# Patient Record
Sex: Female | Born: 1945 | Race: Black or African American | Hispanic: No | Marital: Married | Smoking: Never smoker
Health system: Southern US, Community
[De-identification: ages and names within clinical notes are randomized; demographics above are authoritative.]

## PROBLEM LIST (undated history)

## (undated) DIAGNOSIS — F329 Major depressive disorder, single episode, unspecified: Secondary | ICD-10-CM

## (undated) DIAGNOSIS — K219 Gastro-esophageal reflux disease without esophagitis: Secondary | ICD-10-CM

## (undated) DIAGNOSIS — T451X5A Adverse effect of antineoplastic and immunosuppressive drugs, initial encounter: Secondary | ICD-10-CM

## (undated) DIAGNOSIS — D6481 Anemia due to antineoplastic chemotherapy: Secondary | ICD-10-CM

## (undated) DIAGNOSIS — F32A Depression, unspecified: Secondary | ICD-10-CM

## (undated) DIAGNOSIS — C801 Malignant (primary) neoplasm, unspecified: Secondary | ICD-10-CM

## (undated) DIAGNOSIS — I10 Essential (primary) hypertension: Secondary | ICD-10-CM

## (undated) DIAGNOSIS — F419 Anxiety disorder, unspecified: Secondary | ICD-10-CM

## (undated) HISTORY — PX: PORT-A-CATH REMOVAL: SHX5289

## (undated) HISTORY — PX: BREAST SURGERY: SHX581

---

## 2015-09-09 ENCOUNTER — Other Ambulatory Visit: Payer: Self-pay | Admitting: Unknown Physician Specialty

## 2015-09-09 DIAGNOSIS — R928 Other abnormal and inconclusive findings on diagnostic imaging of breast: Secondary | ICD-10-CM

## 2015-09-09 DIAGNOSIS — R921 Mammographic calcification found on diagnostic imaging of breast: Secondary | ICD-10-CM

## 2015-09-18 ENCOUNTER — Inpatient Hospital Stay: Admission: RE | Admit: 2015-09-18 | Payer: Self-pay | Source: Ambulatory Visit

## 2015-09-25 ENCOUNTER — Other Ambulatory Visit (HOSPITAL_COMMUNITY): Payer: Self-pay | Admitting: Internal Medicine

## 2015-09-25 DIAGNOSIS — C50812 Malignant neoplasm of overlapping sites of left female breast: Secondary | ICD-10-CM

## 2015-09-26 HISTORY — PX: OTHER SURGICAL HISTORY: SHX169

## 2015-09-30 ENCOUNTER — Ambulatory Visit (HOSPITAL_COMMUNITY)
Admission: RE | Admit: 2015-09-30 | Discharge: 2015-09-30 | Disposition: A | Discharge status: Home / Self Care | Payer: Medicare PPO | Source: Ambulatory Visit | Attending: Internal Medicine | Admitting: Internal Medicine

## 2015-09-30 DIAGNOSIS — D259 Leiomyoma of uterus, unspecified: Secondary | ICD-10-CM | POA: Diagnosis not present

## 2015-09-30 DIAGNOSIS — N200 Calculus of kidney: Secondary | ICD-10-CM | POA: Diagnosis not present

## 2015-09-30 DIAGNOSIS — C50812 Malignant neoplasm of overlapping sites of left female breast: Secondary | ICD-10-CM | POA: Diagnosis present

## 2015-09-30 DIAGNOSIS — R938 Abnormal findings on diagnostic imaging of other specified body structures: Secondary | ICD-10-CM | POA: Insufficient documentation

## 2015-09-30 DIAGNOSIS — K7689 Other specified diseases of liver: Secondary | ICD-10-CM | POA: Insufficient documentation

## 2015-09-30 MED ORDER — IOHEXOL 300 MG/ML  SOLN
50.0000 mL | Freq: Once | INTRAMUSCULAR | Status: AC | PRN
  Administered 2015-09-30: 50 mL via ORAL

## 2015-09-30 MED ORDER — IOPAMIDOL (ISOVUE-300) INJECTION 61%
100.0000 mL | Freq: Once | INTRAVENOUS | Status: AC | PRN
  Administered 2015-09-30: 100 mL via INTRAVENOUS

## 2015-10-05 ENCOUNTER — Other Ambulatory Visit (HOSPITAL_COMMUNITY): Payer: Self-pay | Admitting: Internal Medicine

## 2015-10-05 DIAGNOSIS — C50811 Malignant neoplasm of overlapping sites of right female breast: Secondary | ICD-10-CM

## 2015-10-05 DIAGNOSIS — C50812 Malignant neoplasm of overlapping sites of left female breast: Principal | ICD-10-CM

## 2015-10-08 ENCOUNTER — Ambulatory Visit (HOSPITAL_COMMUNITY)
Admission: RE | Admit: 2015-10-08 | Discharge: 2015-10-08 | Disposition: A | Discharge status: Home / Self Care | Payer: Medicare PPO | Source: Ambulatory Visit | Attending: Internal Medicine | Admitting: Internal Medicine

## 2015-10-08 DIAGNOSIS — C50812 Malignant neoplasm of overlapping sites of left female breast: Secondary | ICD-10-CM | POA: Insufficient documentation

## 2015-10-08 DIAGNOSIS — R938 Abnormal findings on diagnostic imaging of other specified body structures: Secondary | ICD-10-CM | POA: Diagnosis not present

## 2015-10-08 DIAGNOSIS — C50811 Malignant neoplasm of overlapping sites of right female breast: Secondary | ICD-10-CM | POA: Diagnosis present

## 2015-10-08 LAB — GLUCOSE, CAPILLARY: GLUCOSE-CAPILLARY: 85 mg/dL (ref 65–99)

## 2015-10-08 MED ORDER — FLUDEOXYGLUCOSE F - 18 (FDG) INJECTION
9.6000 | Freq: Once | INTRAVENOUS | Status: AC | PRN
  Administered 2015-10-08: 9.6 via INTRAVENOUS

## 2015-10-13 ENCOUNTER — Ambulatory Visit
Admission: RE | Admit: 2015-10-13 | Discharge: 2015-10-13 | Disposition: A | Discharge status: Home / Self Care | Payer: Medicare PPO | Source: Ambulatory Visit | Attending: Unknown Physician Specialty | Admitting: Unknown Physician Specialty

## 2015-10-13 DIAGNOSIS — R921 Mammographic calcification found on diagnostic imaging of breast: Secondary | ICD-10-CM

## 2015-10-13 DIAGNOSIS — R928 Other abnormal and inconclusive findings on diagnostic imaging of breast: Secondary | ICD-10-CM

## 2015-11-27 ENCOUNTER — Telehealth: Payer: Self-pay | Admitting: Oncology

## 2015-11-27 NOTE — Telephone Encounter (Signed)
Received a call back from [place] at [facility]. Per Suanne Marker Dr. Jacquiline Doe has spoken with patient re seeing LL 6/5 - please proceed with appointment and per Suanne Marker she will speak with patient. Appointment scheduled and given to [facility] for 11/30/2015 @ 3 pm to arrive 2:30 pm for lab and Dr. Marko Plume. I also spoke with patient re appointment date/time/location/phone number and confirmed demographic and insurance information.

## 2015-11-27 NOTE — Telephone Encounter (Signed)
Received information from desk nurse re transfer of care from Dr. Jacquiline Doe to Dr. Marko Plume. Obtained name/DOB/office note from desk nurse and other demographic information from [place] at [facility]. Per Dr. Mariana Kaufman staff message she can see patient 6/5 @ 3:30 pm with  labs. Spoke with patient re 6/5 appointment and per patient she does not want to have any treatments prior to going on vacation and will resume when she returns. Patient was informed 6/5 would be an initial appointment to establish care with Dr. Marko Plume and will not involve treatment. Per patient she cannot come 11/30/15 as she does not know if she will have someone to bring her. Per patient the only time she can come in next week would be 6/9 which is a Friday. Patient informed Dr. Marko Plume is only in the office on Mondays and Thursdays, however a message would be sent to Dr. Marko Plume to see if a one time Friday visit could be accommodated. Patient also reiterated she does not want any treatments until after she returns from her vacation. Per patient she will leave for vacation 6/12 and return 6/19. Both desk nurse for Dr. Marko Plume and Suanne Marker at [facility] informed. Patient also encourage to speak with Dr. Al Decant office regarding her wishes. Above message routed to Dr. Marko Plume.

## 2015-11-30 ENCOUNTER — Ambulatory Visit (HOSPITAL_BASED_OUTPATIENT_CLINIC_OR_DEPARTMENT_OTHER): Payer: Medicare PPO | Admitting: Oncology

## 2015-11-30 ENCOUNTER — Other Ambulatory Visit: Payer: Self-pay | Admitting: Oncology

## 2015-11-30 ENCOUNTER — Telehealth: Payer: Self-pay | Admitting: Oncology

## 2015-11-30 ENCOUNTER — Other Ambulatory Visit (HOSPITAL_BASED_OUTPATIENT_CLINIC_OR_DEPARTMENT_OTHER): Payer: Medicare PPO

## 2015-11-30 VITALS — BP 168/98 | HR 83 | Temp 98.9°F | Resp 18 | Ht 63.5 in | Wt 188.9 lb

## 2015-11-30 DIAGNOSIS — C801 Malignant (primary) neoplasm, unspecified: Secondary | ICD-10-CM

## 2015-11-30 DIAGNOSIS — D6959 Other secondary thrombocytopenia: Secondary | ICD-10-CM | POA: Diagnosis not present

## 2015-11-30 DIAGNOSIS — T451X5A Adverse effect of antineoplastic and immunosuppressive drugs, initial encounter: Secondary | ICD-10-CM

## 2015-11-30 DIAGNOSIS — D701 Agranulocytosis secondary to cancer chemotherapy: Secondary | ICD-10-CM | POA: Diagnosis not present

## 2015-11-30 DIAGNOSIS — D6481 Anemia due to antineoplastic chemotherapy: Secondary | ICD-10-CM | POA: Insufficient documentation

## 2015-11-30 DIAGNOSIS — Z95828 Presence of other vascular implants and grafts: Secondary | ICD-10-CM | POA: Insufficient documentation

## 2015-11-30 DIAGNOSIS — I10 Essential (primary) hypertension: Secondary | ICD-10-CM | POA: Insufficient documentation

## 2015-11-30 DIAGNOSIS — C50912 Malignant neoplasm of unspecified site of left female breast: Secondary | ICD-10-CM | POA: Diagnosis not present

## 2015-11-30 DIAGNOSIS — K089 Disorder of teeth and supporting structures, unspecified: Secondary | ICD-10-CM

## 2015-11-30 LAB — CBC WITH DIFFERENTIAL/PLATELET
BASO%: 0.7 % (ref 0.0–2.0)
BASOS ABS: 0.1 10*3/uL (ref 0.0–0.1)
EOS ABS: 0 10*3/uL (ref 0.0–0.5)
EOS%: 0.5 % (ref 0.0–7.0)
HCT: 29.1 % — ABNORMAL LOW (ref 34.8–46.6)
HGB: 9.7 g/dL — ABNORMAL LOW (ref 11.6–15.9)
LYMPH%: 17 % (ref 14.0–49.7)
MCH: 30.3 pg (ref 25.1–34.0)
MCHC: 33.3 g/dL (ref 31.5–36.0)
MCV: 91.1 fL (ref 79.5–101.0)
MONO#: 0.7 10*3/uL (ref 0.1–0.9)
MONO%: 10.2 % (ref 0.0–14.0)
NEUT#: 5.2 10*3/uL (ref 1.5–6.5)
NEUT%: 71.6 % (ref 38.4–76.8)
PLATELETS: 76 10*3/uL — AB (ref 145–400)
RBC: 3.2 10*6/uL — AB (ref 3.70–5.45)
RDW: 19 % — ABNORMAL HIGH (ref 11.2–14.5)
WBC: 7.2 10*3/uL (ref 3.9–10.3)
lymph#: 1.2 10*3/uL (ref 0.9–3.3)

## 2015-11-30 LAB — COMPREHENSIVE METABOLIC PANEL
ALT: 10 U/L (ref 0–55)
ANION GAP: 6 meq/L (ref 3–11)
AST: 12 U/L (ref 5–34)
Albumin: 3.5 g/dL (ref 3.5–5.0)
Alkaline Phosphatase: 115 U/L (ref 40–150)
BILIRUBIN TOTAL: 0.55 mg/dL (ref 0.20–1.20)
BUN: 13.7 mg/dL (ref 7.0–26.0)
CHLORIDE: 116 meq/L — AB (ref 98–109)
CO2: 23 meq/L (ref 22–29)
Calcium: 10.4 mg/dL (ref 8.4–10.4)
Creatinine: 1.3 mg/dL — ABNORMAL HIGH (ref 0.6–1.1)
EGFR: 47 mL/min/{1.73_m2} — AB (ref >90)
Glucose: 103 mg/dl (ref 70–140)
Potassium: 4 mEq/L (ref 3.5–5.1)
Sodium: 145 mEq/L (ref 136–145)
Total Protein: 7 g/dL (ref 6.4–8.3)

## 2015-11-30 LAB — TECHNOLOGIST REVIEW

## 2015-11-30 MED ORDER — PROCHLORPERAZINE MALEATE 10 MG PO TABS: 10.0000 mg | ORAL_TABLET | Freq: Four times a day (QID) | ORAL | Status: AC | PRN

## 2015-11-30 MED ORDER — ATENOLOL 50 MG PO TABS: 50.0000 mg | ORAL_TABLET | Freq: Every day | ORAL | Status: DC

## 2015-11-30 NOTE — Progress Notes (Signed)
[facility] NEW PATIENT EVALUATION   Name: Maria Strickland Date: November 30, 2015  MRN: [id number] DOB: 1946-01-06  REFERRING PHYSICIAN: Milus Height, MD CC: Maria Strickland, Maria Strickland   REASON FOR REFERRAL: transfer of medical oncology care, small cell carcinoma left breast   HISTORY OF PRESENT ILLNESS:Maria Strickland is a 70 y.o. female who is seen in consultation, together with daughter Maria Strickland and son Maria Strickland  , at the request of Maria Strickland, for transfer of care under active chemo for small cell carcinoma of left breast. This may be limited disease, tho there was question of splenic lesion on staging, and no imaging of head.   Patient had not had regular medical care in a number of years when she noticed 2 masses in central left breast in Dec 2016. She was seen by Maria Maria Strickland in [place], with bilateral mammograms done 09-02-15 at [facility]. The mammograms, with no priors for comparison, showed in the left breast a dense irregular mass at least 3 cm in greatest dimension, with pleomorphic calcifications extending 5 cm inferomedially  in left breast and with associated nipple inversion and retraction; also in the left breast was a 10 mm irregular mass at 9-10 o'clock. In the right breast an irregular mass centrally slightly medial to nipple 1.3 cm diameter. [place] on left breast showed the mass at 12 o'clock measured 2.8 x 2.4 x 2.5 cm with internal vascularity, and additional hypoechoic mass at 9 o'clock 0.8 x 0.4 x 0.6 cm. In right breast by [place] no suspicious solid or cystic mass identified. 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 Morphologically the neuroendocrine carcinoma is reported to show small to intermediate sized cells with high mitotic count and prominent  necrosis, strongly positive for pancytokeratin and negative for chromogranin A, positive for synaptophysin, strongly positive for TTF-1, ER 1%, PR 105, Ki-67 of 96%. She subsequently had biopsy of the right breast lesion ([id number]) at [facility], no malignancy.  Staging studies done as follows:  Bone scan [facility] 10-05-15 with bilateral anterior lower chest activity not clearly malignant, symmetrical renal activity, osteoarthritis left knee. CT CAP at [facility] 09-30-15 : Chest no hilar or mediastinal adenopathy, thyroid normal, no pericardial effusion, no pulmonary lesions or pleural effusion, no lytic or blastic skeletal lesions. Abdomen numerous hepatic cysts without evidence of hepatic mets, adrenals mildly nodular, numerous renal cysts without hydronephrosis, spleen without focal lesions, pancreas normal, bowels unremarkable other than diverticulosis descending and sigmoid. No adenopathy. Pelvis no adenopathy, uterine fibroids, endometrium thickened at 23 mm , no ascites, bladder normal . PET Elvina Sidle 7-62-83 no hypermetabolic hilar or  Mediastinal nodes, no pulmonary uptake, left breast mass with SUV 22.89 measuring 3.3 cm without uptake in axillary, retropectoral or supraclavicular nodes. An area of uptake anterior spleen with SUV max 5.63 corresponding to low density structure not present on 09-30-15, no liver concerns, no other uptake abdomen or pelvis. No mention of endometrial lining. Diffuse heterogeneous uptake thru axial and appendicular skeleton which would be consistent either with diffuse marrow/ bone involvement or anemia. Maria Strickland discussed with IR, decision made not to attempt biopsy of spleen.  Maria Strickland discussed case with breast oncologist Maria Maria Strickland and Maria Maria Strickland. Recommendation was for neoadjuvant carbo VP16 x 4 cycles with consideration of additional  anthracyclines, then consideration of mastectomy with axillary node evaluation and further management depending  on surgical findings.  Note patient refused MRI brain during initial staging.  Patient received cycle 1 carbo VP16 ~ May 1-2-3, those chemo records not included in outside information. She received neulasta, however developed profound neutropenia without febrile complications, thrombocytopenia and anemia (hgb 7.9) for which she was transfused 2 units PRBCs. Cycle 2 was given 5-22,23,24, reportedly dose reduced from cycle 1. Cycle 2 carbo dose total was 445.5 mg and VP16 118 mg, with decadron 12 mg and Aloxi 0.25 mg. VP16 doses on 5-23 and 5-24 were each 118 mg total , with decadron 12 mg as only antiemetic.  Family tells me that she had neulasta also after cycle 2, that information not in these outside records.  Labs 11-16-15 WBC 7.2, ANC 4.8, Hgb 10.2, plt 202k Chemistries date not clear to me (listed as "2 weeks ago") creat 1.01, BUN 15, calcium 9.9, Tprot 6.4, alb 3.65, AP 90, AST 18, Tbili 0.7, ALT 15, Na 145, k 4.3, Cl 111, glu 94   From my direct phone conversation with Maria Strickland, the left breast mass has improved only slightly with chemo given thus far.  PAC in  She prefers AM appointments as husband works.  REVIEW OF SYSTEMS Usual weight ~ 180 lbs. No HA. Does not use glasses. Mild environmental allergies. No difficulty hearing. Needs dental work but no acute discomfort, does not have dentist. No known thyroid disease. No SOB, cough, chest pain, other respiratory symptoms. No cardiac concerns. Occasional knee pain. No bleeding. No LE swelling. Bladder ok. Bowels moving regularly. No problems with PAC.  Has had very little nausea with chemo thus far, no nausea meds at home. Denies significant fatigue. No fever or symptoms of infection. Breasts not painful. Toes on left foot intermittently numb in last several weeks.   Remainder of full 10 point review of systems negative.   ALLERGIES: Review of patient's allergies indicates no known allergies.  PAST MEDICAL/ SURGICAL HISTORY:    No  surgery No hospitalizations PAC placed by Maria Maria Strickland on atenolol for elevated BP by Maria Evie Lacks at initial visit  CURRENT MEDICATIONS: reviewed as listed now in EMR. Refill compazine and atenolol. She has ativan available, which I have explained can also be useful for nausea. Begin biotene mouthwash 2-3x daily    SOCIAL HISTORY:  From [place], [place] , where she lives with husband. 5 children, 9 grands, 6 great grands. Patient works at [employer]; husband also works in Water engineer at [employer]. No tobacco, no ETOH.  All family going to [place] 6-12 thru 6-16  FAMILY HISTORY:  Father pancreatic cancer Mother "bone cancer" Daughter breast cancer treated by Maria Sonny Dandy 2010, had [facility] Brother with prostate cancer Otherwise in family DM, HTN, asthma          PHYSICAL EXAM:  Strickland is 5' 3.5" (1.613 m) and weight is 188 lb 14.4 oz (85.684 kg). Her oral temperature is 98.9 F (37.2 C). Her blood pressure is 168/98 and her pulse is 83. Her respiration is 18 and oxygen saturation is 100%.  Alert, pleasant, cooperative lady appears comfortable, easily mobile. Respirations not labored Family very supportive.  HEENT:normal hair pattern. PERRL, not icteric. Oral mucosa moist and clear, poor dentition tho no obvious acute infection, some gingivitis. Neck supple without JVD or thyroid mass. Mucous membranes pale.  RESPIRATORY: lungs clear to A and P  CARDIAC/ VASCULAR: heart RRR no gallop, clear heart sounds. Peripheral  pulses intact and symmetrical.   ABDOMEN: soft, not tender, not distended, some normal BS. No appreciable HSM or mass.   LYMPH NODES: no cervical, supraclavicular, axillary or inguinal adenopathy  BREASTS: Left breast with firm rounded but irregular mass just above areola at ~ 1:00, at least 3 - 3.5 cm diameter, not tender, not fixed, nipple inverted and slightly retracted. I cannot appreciate a second mass. No skin changes, no nipple discharge, no swelling LUE. Right  breast without palpable mass, skin or nipple findings.  NEUROLOGIC: CN, motor, sensory, cerebellar nonfocal  SKIN: nailbeds pale. No rash, ecchymosis, petechiae  MUSCULOSKELETAL: symmetrical muscle mass in extremities. Back not tender. LE without edema, cords, tenderness  PAC without tenderness, slightly angled  LABORATORY DATA:  Results for orders placed or performed in visit on 11/30/15 (from the past 48 hour(s))  CBC with Differential     Status: Abnormal   Collection Time: 11/30/15  2:30 PM  Result Value Ref Range   WBC 7.2 3.9 - 10.3 10e3/uL   NEUT# 5.2 1.5 - 6.5 10e3/uL   HGB 9.7 (L) 11.6 - 15.9 g/dL   HCT 29.1 (L) 34.8 - 46.6 %   Platelets 76 (L) 145 - 400 10e3/uL   MCV 91.1 79.5 - 101.0 fL   MCH 30.3 25.1 - 34.0 pg   MCHC 33.3 31.5 - 36.0 g/dL   RBC 3.20 (L) 3.70 - 5.45 10e6/uL   RDW 19.0 (H) 11.2 - 14.5 %   lymph# 1.2 0.9 - 3.3 10e3/uL   MONO# 0.7 0.1 - 0.9 10e3/uL   Eosinophils Absolute 0.0 0.0 - 0.5 10e3/uL   Basophils Absolute 0.1 0.0 - 0.1 10e3/uL   NEUT% 71.6 38.4 - 76.8 %   LYMPH% 17.0 14.0 - 49.7 %   MONO% 10.2 0.0 - 14.0 %   EOS% 0.5 0.0 - 7.0 %   BASO% 0.7 0.0 - 2.0 %  Comprehensive metabolic panel     Status: Abnormal   Collection Time: 11/30/15  2:30 PM  Result Value Ref Range   Sodium 145 136 - 145 mEq/L   Potassium 4.0 3.5 - 5.1 mEq/L   Chloride 116 (H) 98 - 109 mEq/L   CO2 23 22 - 29 mEq/L   Glucose 103 70 - 140 mg/dl    Comment: Glucose reference range is for nonfasting patients. Fasting glucose reference range is 70- 100.   BUN 13.7 7.0 - 26.0 mg/dL   Creatinine 1.3 (H) 0.6 - 1.1 mg/dL   Total Bilirubin 0.55 0.20 - 1.20 mg/dL   Alkaline Phosphatase 115 40 - 150 U/L   AST 12 5 - 34 U/L   ALT 10 0 - 55 U/L   Total Protein 7.0 6.4 - 8.3 g/dL   Albumin 3.5 3.5 - 5.0 g/dL   Calcium 10.4 8.4 - 10.4 mg/dL   Anion Gap 6 3 - 11 mEq/L   EGFR 47 (L) >90 ml/min/1.73 m2    Comment: eGFR is calculated using the CKD-EPI Creatinine Equation (2009)   TECHNOLOGIST REVIEW     Status: None   Collection Time: 11/30/15  2:30 PM  Result Value Ref Range   Technologist Review occassional meta       PATHOLOGY: WANDA, RIDEOUT Collected: 10/13/2015 Client: [facility] Accession: [id number] Received: 10/13/2015 Lajean Manes, MD DOB: [birth year] Age: 34 Gender: F Reported: 10/14/2015 [place] Patient Ph: [phone] MRN #: [id number] [place], [place] [address] Client Acc#: Chart #: [id number] Phone: [phone] Fax: CC: Maria Crown, MD  REPORT OF SURGICAL PATHOLOGY FINAL DIAGNOSIS Diagnosis Breast, right, needle core biopsy, 3:00 o'clock - FIBROADENOMA. - THERE IS NO EVIDENCE OF MALIGNANCY. - SEE COMMENT. Microscopic Comment The results were called to [facility] on 10/14/2015.    Full path report from left breast biopsy 09-09-15 is not included in outside records and will be requested.  Records do include report of ER<1%, PR 10%, HER 2 IHC 2+ with FISH negative (Path [id number] from specimen collected 09-09-15)     RADIOGRAPHY:  NUCLEAR MEDICINE PET SKULL BASE TO THIGH  10-08-15 TECHNIQUE: 9.6 mCi F-18 FDG was injected intravenously. Full-ring PET imaging was performed from the skull base to thigh after the radiotracer. CT data was obtained and used for attenuation correction and anatomic localization.  FASTING BLOOD GLUCOSE: Value: 85 mg/dl  COMPARISON: CT from 09/30/2015  FINDINGS: NECK  Tiny nodule within the posterior aspect of the right parotid gland measures 8 mm and has an SUV max equal to 3.86.  CHEST  No hypermetabolic mediastinal or hilar nodes. No suspicious pulmonary nodules on the CT scan. Left breast mass containing clip measures 3.3 cm and has an SUV max equal to 22.89. No hypermetabolic axillary, retropectoral, or supraclavicular lymph nodes. No suspicious pulmonary nodules are identified.  ABDOMEN/PELVIS  There is a medium size focus of  moderate increased uptake within the anterior spleen. This may at scratch set this has an SUV max equal to 5.63. In retrospect, this corresponds with a 3.3 cm low-attenuation structure that was not present on 09/30/2015. No suspicious liver abnormality identified. Multiple liver cysts. No abnormal uptake within the pancreas or adrenal glands. No hypermetabolic lymph nodes identified within the upper abdomen or pelvis.  SKELETON  Suspicious and heterogeneous activity is identified throughout the axial and appendicular skeleton. There are more focal areas of increased uptake identified. For example, within the proximal left femur there is a focal area of increased uptake with an SUV max equal 6.4. Within the right iliac bone there is asymmetric increased uptake within SUV max equal to 5.5. Increased heterogeneous activity throughout the spine is also noted.  IMPRESSION: 1. There is intense radiotracer uptake associated with the hypermetabolic left breast mass. 2. Diffuse heterogeneous uptake is identified throughout the axial and appendicular skeleton. The differential considerations for this appearance include diffuse marrow or osseous involvement by tumor, treatment related changes versus anemia. 3. There is an indeterminate area of increased uptake within the anterior spleen corresponding to a well-circumscribed hypo enhancing lesion present on CT from 09/30/2015. Cannot rule out splenic involvement by tumor.    EXAM: CT CHEST, ABDOMEN, AND PELVIS WITH CONTRAST   09-30-15  TECHNIQUE: Multidetector CT imaging of the chest, abdomen and pelvis was performed following the standard protocol during bolus administration of intravenous contrast.  CONTRAST: 121m ISOVUE-300 IOPAMIDOL (ISOVUE-300) INJECTION 61%  COMPARISON: None.  FINDINGS: CT CHEST FINDINGS  Mediastinum/Lymph Nodes: There is AA large left breast lesion measuring a maximum of 33 mm. A small probable  surgical clip markers noted also. No supraclavicular or axillary lymphadenopathy is identified. Small scattered lymph nodes are noted.  The thyroid gland appears normal.  The heart is normal in size. No pericardial effusion. The aorta is normal in caliber. No dissection. Coronary artery calcifications are noted.  No mediastinal or hilar mass or adenopathy. The esophagus is unremarkable.  Lungs/Pleura: No acute pulmonary findings. No worrisome pulmonary lesions to suggest metastatic disease. No pleural effusion.  Musculoskeletal: Moderate degenerative changes involving the thoracic spine. No  lytic or blastic bone lesions to suggest metastatic disease.  CT ABDOMEN PELVIS FINDINGS  Hepatobiliary: There are numerous hepatic cysts. A 7.5 cm cyst projecting off the inferior aspect of the right lobe has thin rim-like calcifications. It has mild mass effect on the gallbladder. The other cysts appear simple. No worrisome hepatic lesions to suggest metastatic disease. The gallbladder is grossly normal. No common bile duct dilatation.  Pancreas: No mass, inflammation or ductal dilatation.  Spleen: Normal size. No focal lesions.  Adrenals/Urinary Tract: The adrenal glands demonstrate mild nodularity but no discrete lesion. There are numerous bilateral renal cysts and right renal calculi. Rotation anomalies of both kidneys but no worrisome lesions or hydronephrosis.  Stomach/Bowel: The stomach, duodenum, small bowel and colon are unremarkable. No inflammatory changes, mass lesions or obstructive findings. The terminal ileum is normal. The appendix is normal. Moderate to advanced diverticulosis involving the descending and sigmoid colon but no findings for acute diverticulitis.  Vascular/Lymphatic: No mesenteric or retroperitoneal mass or adenopathy. The aorta is normal in caliber. Minimal scattered atherosclerotic calcifications. The branch vessels are patent. The major  venous structures are patent.  Reproductive: Uterine fibroids are noted with associated calcifications. Knee endometrium is abnormal for the patient's age measuring 29 mm. Could not exclude endometrial hyperplasia or cancer. Recommend endometrial sampling.  Both ovaries are normal.  Other: No pelvic mass or lymphadenopathy. No free pelvic fluid collections. The bladder appears normal. No inguinal mass or adenopathy.  Musculoskeletal: No significant bony findings.  IMPRESSION: 1. 3.3 cm left breast mass consistent with known breast cancer. No supraclavicular or axillary lymphadenopathy and no findings for metastatic disease involving the chest, abdomen or pelvis. 2. Numerous benign-appearing hepatic cysts. There are also numerous renal cysts. 3. Endometrial thickening for age. Could not exclude endometrial cancer. Recommend endometrial sampling. Small scattered calcified uterine fibroids are noted. 4. The ovaries are normal. 5. Right renal calculi. 6. No findings suspicious for osseous metastatic disease.       ADDENDUM REPORT: 10/14/2015 12:27 ADDENDUM: Pathology revealed FIBROADENOMA of the Right breast at the 3:00 location. This was found to be concordant by Maria. Lajean Manes. Pathology results were discussed with the patient by telephone. The patient reported doing well after the biopsy with tenderness at the site. Post biopsy instructions and care were reviewed and questions were answered. The patient was encouraged to call [facility] for any additional concerns. The patient was instructed to return for annual diagnostic mammography at [facility] in [place], [place]. Pathology results reported by Terie Purser, RN on 10/14/2015. Electronically Signed  By: Lajean Manes M.D.  On: 10/14/2015 12:27     Study Result     CLINICAL DATA: Patient has biopsy-proven neuroendocrine carcinoma inner left breast. She presents for  stereotactic core needle biopsy of a small right breast mass.  EXAM: RIGHT BREAST STEREOTACTIC CORE NEEDLE BIOPSY  COMPARISON: Previous exams.  FINDINGS: The patient and I discussed the procedure of stereotactic-guided biopsy including benefits and alternatives. We discussed the high likelihood of a successful procedure. We discussed the risks of the procedure including infection, bleeding, tissue injury, clip migration, and inadequate sampling. Informed written consent was given. The usual time out protocol was performed immediately prior to the procedure.  Using sterile technique and 1% Lidocaine as local anesthetic, under stereotactic guidance, a 9 gauge vacuum assist device was used to perform core needle biopsy of the small mass in the medial central aspect of the right breast using a cranial caudal approach.  Specimen radiograph was performed showing to round smooth calcifications which were within the small mass. Specimens with calcifications are identified for pathology.  At the conclusion of the procedure, a coil shaped tissue marker clip was deployed into the biopsy cavity. Follow-up 2-view mammogram was performed and dictated separately.  IMPRESSION: Stereotactic-guided biopsy of a small right breast mass. No apparent complications   Outside reports received and will be scanned into this EMR: Bone scan [facility] 10-05-15: symmetric bilateral anterior chest activity, no specific metastatic pattern, osteoarthritis left knee Mammogram digital diagnostic bilateral  [facility] 09-02-15: dense irregular mass retroareolar left breast at least 3 cm greatest dimension with pleomorphic calcifications and associated left nipple retraction. Additionally an irregular mass inner left breast 9-10 o'clock~ 10 mm and irregular mass in central right breast 1.3 cm. [place] left breast irregular mass with internal vascularity 12 o'clock 1 cm from nipple2.8 x 2.4 x 2.5  cm with associated pleomorphic calcifications extending 5 cm inferomedial to mass. Additional irregular hypoechoic mass left breast at 9 o'clock 0.8 x 0.4 x 0.6 cm. Additional mass right breast has no sonographic correlate. Report of diagnostic left mammmogram post US biopsy 09-09-15   Pathology report from left breast biopsy 09-09-15 not included in this outside information and will be requested from [facility]  DISCUSSION: History above reviewed with patient and family members. They understand that small cell cancers most commonly originate in lung, tho can occur in other areas. I have reminded them that small cell carcinomas tend to spread early to multiple parts of body, including at times to brain. I agree with Maria Strickland that slow response to chemo is not typical for other small cell carcinomas.  Patient and family very much appreciate care by Maria Strickland and at [facility] in [place]. I have told them that [place] center may be an option if traveling to [place] is burdensome, however they prefer to come to [facility] for now.  Patient feels that she is tolerating chemotherapy very adequately and is in agreement with continuing. Verbal consent for chemo given. With CBC today more anemia (hgb 9.7) and thrombocytopenia (plt 76k), will request repeat CBC in [place] later this week, to be sure she does not need transfusion prior to leaving for [place].  We will treat cycle 3 at [facility] 6-19,20,21 as long as [place] >=1.5 and plt >=100k on day 1, with on pro neulasta if possible. I will see her back with counts and exam after cycle 3.    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 Some limited response to first 2 cycles of carbo VP16 per Maria Strickland. Will give cycle 3 June 19-20-21 with neulasta as long as blood counts meet parameters as above. 2. Chemo neutropenia despite neulasta cycle 1,  chemo thrombocytopenia and anemia requiring PRBCs also cycle 1. Cycle 2 was dose reduced, neulasta given. Will repeat CBC end of this week in [place] prior to her trip to [place], results to be faxed/ called to this office (or will be done in [place] if unable to get in [place]). 3.FIbroadenoma right breast by stereotactic biopsy 10-14-15 4.PAC in 5.family history of breast cancer in daughter, pancreatic in father, "bone cancer" in mother and prostate cancer in brother 6.dental and gum disease: biotene, may need to ask dental medicine to see 7.osteoarthritis left knee  Patient and accompanying individuals have had questions answered to their  satisfaction and are in agreement with plan above. They can contact this office for questions or concerns at any time prior to next scheduled visit.  Chemo orders and neulasta placed, requested on pro neulasta to save another trip to [place], managed care notified for preauthorization. Time spent 90 min , including >50% discussion, review of outside records with discussion directly with Maria Strickland, and coordination of care. Cc Drs Maria Strickland, [place], Strickland    [name], MD 11/30/2015 4:57 PM

## 2015-11-30 NOTE — Telephone Encounter (Signed)
returned call and lvm confirming todays appt °

## 2015-12-01 ENCOUNTER — Telehealth: Payer: Self-pay | Admitting: Oncology

## 2015-12-01 ENCOUNTER — Telehealth: Payer: Self-pay

## 2015-12-01 NOTE — Telephone Encounter (Signed)
Prescriptions listed under #1 were sent into patient pharmacy. Message left in daughter Tonya's vm for appointment at [facility] 12-03-15 at 0900 to check CBC/diff as noted below by Dr. Marko Plume. Prescription faxed to Michelle's attention for lab and requested that the results be faxed that am to Dr. Marko Plume at [phone].

## 2015-12-01 NOTE — Telephone Encounter (Signed)
-----   Message from [name], MD sent at 11/30/2015  5:09 PM EDT ----- 1.  Prescriptions for  Compazine 10 mg q 6 hr prn nausea #30 Refill atenolol #30 2 RF  2.on 6-6, please fax CBC and CMET from today to Dr Jacquiline Doe at [facility] fax [phone], phone [phone]. Please ask if [facility] can repeat CBC on 6-8 and get results to [place] that day, as patient may need transfusion prior to going to [place] 6-12 thru 12-11-15.  RN please watch for the CBC on Thurs 6-8 If cannot do lab at [facility], ok to do here  3.POF in. FYI I told them that we would let patient and daughter Malie Paszkowski [phone] know appointments, until they can get printed copies/ until they can get on MyChart  thanks

## 2015-12-01 NOTE — Telephone Encounter (Signed)
Spoke with patient daughter Lavella Lemons to confirm upcoming appts date/times per LL 6/5 pof

## 2015-12-03 ENCOUNTER — Telehealth: Payer: Self-pay

## 2015-12-03 NOTE — Telephone Encounter (Signed)
-----   Message from [name], MD sent at 12/03/2015  2:49 PM EDT ----- CBC from [facility] 12-03-15: WBC 9.5, ANC 6.9, Hgb 9.4, plt 132K  Please let patient know that WBC is in good range, platelets are much better at 132 and anemia is ~ same. Nothing else needed from this standpoint before her vacation  thanks

## 2015-12-03 NOTE — Telephone Encounter (Signed)
Told Ms. Luckenbach the results of the  CBC/diff as noted below by Dr. Marko Plume. Ms Mcgivney verbalized understanding.

## 2015-12-06 ENCOUNTER — Encounter: Payer: Self-pay | Admitting: Oncology

## 2015-12-09 ENCOUNTER — Other Ambulatory Visit: Payer: Self-pay | Admitting: Oncology

## 2015-12-09 DIAGNOSIS — C801 Malignant (primary) neoplasm, unspecified: Secondary | ICD-10-CM

## 2015-12-14 ENCOUNTER — Ambulatory Visit (HOSPITAL_BASED_OUTPATIENT_CLINIC_OR_DEPARTMENT_OTHER): Payer: Medicare PPO | Admitting: Oncology

## 2015-12-14 ENCOUNTER — Encounter: Payer: Self-pay | Admitting: Oncology

## 2015-12-14 ENCOUNTER — Telehealth: Payer: Self-pay | Admitting: *Deleted

## 2015-12-14 ENCOUNTER — Ambulatory Visit (HOSPITAL_BASED_OUTPATIENT_CLINIC_OR_DEPARTMENT_OTHER): Payer: Medicare PPO

## 2015-12-14 ENCOUNTER — Ambulatory Visit: Payer: Medicare PPO

## 2015-12-14 ENCOUNTER — Other Ambulatory Visit: Payer: Self-pay

## 2015-12-14 ENCOUNTER — Other Ambulatory Visit: Payer: Self-pay | Admitting: Oncology

## 2015-12-14 ENCOUNTER — Other Ambulatory Visit (HOSPITAL_BASED_OUTPATIENT_CLINIC_OR_DEPARTMENT_OTHER): Payer: Medicare PPO

## 2015-12-14 VITALS — BP 137/82 | HR 86 | Temp 98.3°F | Resp 18 | Ht 63.5 in | Wt 188.5 lb

## 2015-12-14 DIAGNOSIS — C801 Malignant (primary) neoplasm, unspecified: Secondary | ICD-10-CM

## 2015-12-14 DIAGNOSIS — D701 Agranulocytosis secondary to cancer chemotherapy: Secondary | ICD-10-CM

## 2015-12-14 DIAGNOSIS — C50812 Malignant neoplasm of overlapping sites of left female breast: Secondary | ICD-10-CM

## 2015-12-14 DIAGNOSIS — M859 Disorder of bone density and structure, unspecified: Secondary | ICD-10-CM | POA: Diagnosis not present

## 2015-12-14 DIAGNOSIS — D6959 Other secondary thrombocytopenia: Secondary | ICD-10-CM

## 2015-12-14 DIAGNOSIS — M1712 Unilateral primary osteoarthritis, left knee: Secondary | ICD-10-CM | POA: Diagnosis not present

## 2015-12-14 DIAGNOSIS — Z5111 Encounter for antineoplastic chemotherapy: Secondary | ICD-10-CM | POA: Diagnosis not present

## 2015-12-14 DIAGNOSIS — D6481 Anemia due to antineoplastic chemotherapy: Secondary | ICD-10-CM

## 2015-12-14 LAB — CBC WITH DIFFERENTIAL/PLATELET
BASO%: 0.8 % (ref 0.0–2.0)
BASOS ABS: 0 10*3/uL (ref 0.0–0.1)
EOS%: 0.8 % (ref 0.0–7.0)
Eosinophils Absolute: 0 10*3/uL (ref 0.0–0.5)
HCT: 30.8 % — ABNORMAL LOW (ref 34.8–46.6)
HGB: 10.4 g/dL — ABNORMAL LOW (ref 11.6–15.9)
LYMPH%: 18.8 % (ref 14.0–49.7)
MCH: 31 pg (ref 25.1–34.0)
MCHC: 33.8 g/dL (ref 31.5–36.0)
MCV: 91.9 fL (ref 79.5–101.0)
MONO#: 0.5 10*3/uL (ref 0.1–0.9)
MONO%: 8.7 % (ref 0.0–14.0)
NEUT#: 3.8 10*3/uL (ref 1.5–6.5)
NEUT%: 70.9 % (ref 38.4–76.8)
Platelets: 112 10*3/uL — ABNORMAL LOW (ref 145–400)
RBC: 3.35 10*6/uL — ABNORMAL LOW (ref 3.70–5.45)
RDW: 18.4 % — ABNORMAL HIGH (ref 11.2–14.5)
WBC: 5.3 10*3/uL (ref 3.9–10.3)
lymph#: 1 10*3/uL (ref 0.9–3.3)

## 2015-12-14 LAB — COMPREHENSIVE METABOLIC PANEL
ALBUMIN: 3.5 g/dL (ref 3.5–5.0)
ALK PHOS: 74 U/L (ref 40–150)
ALT: 11 U/L (ref 0–55)
AST: 12 U/L (ref 5–34)
Anion Gap: 8 mEq/L (ref 3–11)
BUN: 17.9 mg/dL (ref 7.0–26.0)
CO2: 21 meq/L — AB (ref 22–29)
Calcium: 10.5 mg/dL — ABNORMAL HIGH (ref 8.4–10.4)
Chloride: 117 mEq/L — ABNORMAL HIGH (ref 98–109)
Creatinine: 1.2 mg/dL — ABNORMAL HIGH (ref 0.6–1.1)
EGFR: 52 mL/min/{1.73_m2} — ABNORMAL LOW (ref >90)
GLUCOSE: 111 mg/dL (ref 70–140)
POTASSIUM: 4.1 meq/L (ref 3.5–5.1)
SODIUM: 146 meq/L — AB (ref 136–145)
Total Bilirubin: 0.61 mg/dL (ref 0.20–1.20)
Total Protein: 6.8 g/dL (ref 6.4–8.3)

## 2015-12-14 MED ORDER — SODIUM CHLORIDE 0.9 % IV SOLN
10.0000 mg | Freq: Once | INTRAVENOUS | Status: AC
  Administered 2015-12-14: 10 mg via INTRAVENOUS
  Filled 2015-12-14: qty 1

## 2015-12-14 MED ORDER — PALONOSETRON HCL INJECTION 0.25 MG/5ML
0.2500 mg | Freq: Once | INTRAVENOUS | Status: AC
  Administered 2015-12-14: 0.25 mg via INTRAVENOUS

## 2015-12-14 MED ORDER — PALONOSETRON HCL INJECTION 0.25 MG/5ML
INTRAVENOUS | Status: AC
  Filled 2015-12-14: qty 5

## 2015-12-14 MED ORDER — SODIUM CHLORIDE 0.9 % IV SOLN
342.8000 mg | Freq: Once | INTRAVENOUS | Status: AC
  Administered 2015-12-14: 340 mg via INTRAVENOUS
  Filled 2015-12-14: qty 34

## 2015-12-14 MED ORDER — HEPARIN SOD (PORK) LOCK FLUSH 100 UNIT/ML IV SOLN
500.0000 [IU] | Freq: Once | INTRAVENOUS | Status: AC | PRN
  Administered 2015-12-14: 500 [IU]
  Filled 2015-12-14: qty 5

## 2015-12-14 MED ORDER — SODIUM CHLORIDE 0.9% FLUSH
10.0000 mL | INTRAVENOUS | Status: DC | PRN
  Administered 2015-12-14: 10 mL
  Filled 2015-12-14: qty 10

## 2015-12-14 MED ORDER — SODIUM CHLORIDE 0.9 % IV SOLN
60.0000 mg/m2 | Freq: Once | INTRAVENOUS | Status: AC
  Administered 2015-12-14: 120 mg via INTRAVENOUS
  Filled 2015-12-14: qty 6

## 2015-12-14 MED ORDER — SODIUM CHLORIDE 0.9 % IV SOLN
Freq: Once | INTRAVENOUS | Status: AC
  Administered 2015-12-14: 10:00:00 via INTRAVENOUS

## 2015-12-14 NOTE — Progress Notes (Signed)
All labs drawn by Phlebotomist today. 

## 2015-12-14 NOTE — Patient Instructions (Signed)
[facility] Discharge Instructions for Patients Receiving Chemotherapy  Today you received the following chemotherapy agents: Carboplatin, Etopiside  To help prevent nausea and vomiting after your treatment, we encourage you to take your nausea medication as prescribed.   If you develop nausea and vomiting that is not controlled by your nausea medication, call the clinic.   BELOW ARE SYMPTOMS THAT SHOULD BE REPORTED IMMEDIATELY:  *FEVER GREATER THAN 100.5 F  *CHILLS WITH OR WITHOUT FEVER  NAUSEA AND VOMITING THAT IS NOT CONTROLLED WITH YOUR NAUSEA MEDICATION  *UNUSUAL SHORTNESS OF BREATH  *UNUSUAL BRUISING OR BLEEDING  TENDERNESS IN MOUTH AND THROAT WITH OR WITHOUT PRESENCE OF ULCERS  *URINARY PROBLEMS  *BOWEL PROBLEMS  UNUSUAL RASH Items with * indicate a potential emergency and should be followed up as soon as possible.  Feel free to call the clinic you have any questions or concerns. The clinic phone number is [phone].  Please show the [place] at check-in to the Emergency Department and triage nurse.

## 2015-12-14 NOTE — Telephone Encounter (Signed)
Call from flush nurse states, no documentation of port placement found in chart. Call placed to [facility], requested port placement for pt receiving treatment today. RN at [facility] advised she would fax copy of placement.

## 2015-12-14 NOTE — Progress Notes (Signed)
[facility] OFFICE PROGRESS NOTE   Name: Maria Strickland Date: December 14, 2015  MRN: [id number] DOB: April 05, 1946  REFERRING PHYSICIAN: No ref. provider found CC: [name], [place]   INTERVAL HISTORY: Maria Strickland is a 70 y.o. 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 This may be limited disease, tho there was question of splenic lesion on staging, and no imaging of head. The patient is due for her third cycle of carboplatin with etoposide this week. First 2 cycles chemotherapy were given in [place], [place]. Since her last visit, the patient went to [place] to have a family reunion. She did quite well while she was down there and had only 1 episode of nausea. She took Zofran with immediate relief and otherwise felt well. She denies fevers, chills, chest pain, shortness of breath, abdominal pain, nausea, and vomiting. Bowels are moving well.  Port-A-Cath in.  ONCOLOGIC HISTORY:  Patient had not had regular medical care in a number of years when she noticed 2 masses in central left breast in Dec 2016. She was seen by Dr [name] in [place], with bilateral mammograms done 09-02-15 at [facility]. The mammograms, with no priors for comparison, showed in the left breast a dense irregular mass at least 3 cm in greatest dimension, with pleomorphic calcifications extending 5 cm inferomedially  in left breast and with associated nipple inversion and retraction; also in the left breast was a 10 mm irregular mass at 9-10 o'clock. In the right breast an irregular mass centrally slightly medial to nipple 1.3 cm diameter. [place] on left breast showed the mass at 12 o'clock measured 2.8 x 2.4 x 2.5 cm with internal vascularity, and additional hypoechoic mass at 9 o'clock 0.8 x 0.4 x 0.6 cm. In right breast by [place] no suspicious solid or cystic mass identified. 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 Morphologically the neuroendocrine carcinoma is reported to show small to intermediate sized cells with high mitotic count and prominent necrosis, strongly positive for pancytokeratin and negative for chromogranin A, positive for synaptophysin, strongly positive for TTF-1, ER 1%, PR 105, Ki-67 of 96%. She subsequently had biopsy of the right breast lesion ([id number]) at [facility], no malignancy.  Staging studies done as follows:  Bone scan [facility] 10-05-15 with bilateral anterior lower chest activity not clearly malignant, symmetrical renal activity, osteoarthritis left knee. CT CAP at [facility] 09-30-15 : Chest no hilar or mediastinal adenopathy, thyroid normal, no pericardial effusion, no pulmonary lesions or pleural effusion, no lytic or blastic skeletal lesions. Abdomen numerous hepatic cysts without evidence of hepatic mets, adrenals mildly nodular, numerous renal cysts without hydronephrosis, spleen without focal lesions, pancreas normal, bowels unremarkable other than diverticulosis descending and sigmoid. No adenopathy. Pelvis no adenopathy, uterine fibroids, endometrium thickened at 23 mm , no ascites, bladder normal . PET Elvina Sidle 9-62-22 no hypermetabolic hilar or  Mediastinal nodes, no pulmonary uptake, left breast mass with SUV 22.89 measuring 3.3 cm without uptake in axillary, retropectoral or supraclavicular nodes. An area of uptake anterior spleen with SUV max 5.63 corresponding to low density structure not present on 09-30-15, no liver concerns, no other uptake abdomen or pelvis. No mention of endometrial lining. Diffuse heterogeneous uptake thru axial  and appendicular skeleton which would be consistent either with diffuse marrow/ bone involvement or anemia. Dr Jacquiline Doe  discussed with IR, decision made not to attempt biopsy of spleen.  Dr Jacquiline Doe discussed case with breast oncologist Dr Myra Gianotti Muss and Dr L.Curey. Recommendation was for neoadjuvant carbo VP16 x 4 cycles with consideration of additional anthracyclines, then consideration of mastectomy with axillary node evaluation and further management depending on surgical findings.  Note patient refused MRI brain during initial staging.  Patient received cycle 1 carbo VP16 ~ May 1-2-3, those chemo records not included in outside information. She received neulasta, however developed profound neutropenia without febrile complications, thrombocytopenia and anemia (hgb 7.9) for which she was transfused 2 units PRBCs. Cycle 2 was given 5-22,23,24, reportedly dose reduced from cycle 1. Cycle 2 carbo dose total was 445.5 mg and VP16 118 mg, with decadron 12 mg and Aloxi 0.25 mg. VP16 doses on 5-23 and 5-24 were each 118 mg total , with decadron 12 mg as only antiemetic.  Family tells me that she had neulasta also after cycle 2, that information not in these outside records.  Labs 11-16-15 WBC 7.2, ANC 4.8, Hgb 10.2, plt 202k Chemistries date not clear to me (listed as "2 weeks ago") creat 1.01, BUN 15, calcium 9.9, Tprot 6.4, alb 3.65, AP 90, AST 18, Tbili 0.7, ALT 15, Na 145, k 4.3, Cl 111, glu 94   From direct phone conversation with Dr Jacquiline Doe, the left breast mass has improved only slightly with chemo given thus far.  PAC in  She prefers AM appointments as husband works.  REVIEW OF SYSTEMS Appetite has been good. Denies signiFIcant fatigue. No fever or symptoms of infection. Breasts not painful. Toes on left foot intermittently numb in last several weeks, but states neuropathy has resolved since returning from [place].   PHYSICAL EXAM:  height is 5' 3.5" (1.613 m) and weight is 188 lb 8 oz (85.503 kg). Her oral temperature is 98.3 F (36.8 C). Her blood pressure is 137/82 and her pulse is 86. Her respiration is 18  and oxygen saturation is 100%.  Alert, pleasant, cooperative lady appears comfortable, easily mobile. Respirations not labored Family very supportive.  HEENT:normal hair pattern. PERRL, not icteric. Oral mucosa moist and clear, poor dentition tho no obvious acute infection, some gingivitis. Neck supple without JVD or thyroid mass. Mucous membranes pale.  RESPIRATORY: lungs clear to A and P  CARDIAC/ VASCULAR: heart RRR no gallop, clear heart sounds. Peripheral pulses intact and symmetrical.   ABDOMEN: soft, not tender, not distended, some normal BS. No appreciable HSM or mass.   LYMPH NODES: no cervical, supraclavicular, axillary or inguinal adenopathy  BREASTS: Deferred.  NEUROLOGIC: CN, motor, sensory, cerebellar nonfocal  SKIN: nailbeds pale. No rash, ecchymosis, petechiae  MUSCULOSKELETAL: symmetrical muscle mass in extremities. Back not tender. LE without edema, cords, tenderness  PAC without tenderness, slightly angled  LABORATORY DATA:  Results for orders placed or performed in visit on 12/14/15 (from the past 48 hour(s))  CBC with Differential     Status: Abnormal   Collection Time: 12/14/15  7:54 AM  Result Value Ref Range   WBC 5.3 3.9 - 10.3 10e3/uL   NEUT# 3.8 1.5 - 6.5 10e3/uL   HGB 10.4 (L) 11.6 - 15.9 g/dL   HCT 30.8 (L) 34.8 - 46.6 %   Platelets 112 (L) 145 - 400 10e3/uL   MCV 91.9 79.5 - 101.0 fL   MCH 31.0 25.1 - 34.0 pg   MCHC 33.8 31.5 -  36.0 g/dL   RBC 3.35 (L) 3.70 - 5.45 10e6/uL   RDW 18.4 (H) 11.2 - 14.5 %   lymph# 1.0 0.9 - 3.3 10e3/uL   MONO# 0.5 0.1 - 0.9 10e3/uL   Eosinophils Absolute 0.0 0.0 - 0.5 10e3/uL   Basophils Absolute 0.0 0.0 - 0.1 10e3/uL   NEUT% 70.9 38.4 - 76.8 %   LYMPH% 18.8 14.0 - 49.7 %   MONO% 8.7 0.0 - 14.0 %   EOS% 0.8 0.0 - 7.0 %   BASO% 0.8 0.0 - 2.0 %  Comprehensive metabolic panel     Status: Abnormal   Collection Time: 12/14/15  7:54 AM  Result Value Ref Range   Sodium 146 (H) 136 - 145 mEq/L   Potassium 4.1 3.5 -  5.1 mEq/L   Chloride 117 (H) 98 - 109 mEq/L   CO2 21 (L) 22 - 29 mEq/L   Glucose 111 70 - 140 mg/dl    Comment: Glucose reference range is for nonfasting patients. Fasting glucose reference range is 70- 100.   BUN 17.9 7.0 - 26.0 mg/dL   Creatinine 1.2 (H) 0.6 - 1.1 mg/dL   Total Bilirubin 0.61 0.20 - 1.20 mg/dL   Alkaline Phosphatase 74 40 - 150 U/L   AST 12 5 - 34 U/L   ALT 11 0 - 55 U/L   Total Protein 6.8 6.4 - 8.3 g/dL   Albumin 3.5 3.5 - 5.0 g/dL   Calcium 10.5 (H) 8.4 - 10.4 mg/dL   Anion Gap 8 3 - 11 mEq/L   EGFR 52 (L) >90 ml/min/1.73 m2    Comment: eGFR is calculated using the CKD-EPI Creatinine Equation (2009)      PATHOLOGY: [place], [place] Collected: 10/13/2015 Client: [facility] Accession: [id number] Received: 10/13/2015 Lajean Manes, MD DOB: Aug 02, 1945 Age: 24 Gender: F Reported: 10/14/2015 [place] Patient Ph: [phone] MRN #: [id number] [place], [place] [address] Client Acc#: Chart #: [id number] Phone: [phone] Fax: CC: [name], MD REPORT OF SURGICAL PATHOLOGY FINAL DIAGNOSIS Diagnosis Breast, right, needle core biopsy, 3:00 o'clock - FIBROADENOMA. - THERE IS NO EVIDENCE OF MALIGNANCY. - SEE COMMENT. Microscopic Comment The results were called to [facility] on 10/14/2015.    Full path report from left breast biopsy 09-09-15 is not included in outside records and will be requested.  Records do include report of ER<1%, PR 10%, HER 2 IHC 2+ with FISH negative (Path [id number] from specimen collected 09-09-15)     RADIOGRAPHY:  NUCLEAR MEDICINE PET SKULL BASE TO THIGH  10-08-15 TECHNIQUE: 9.6 mCi F-18 FDG was injected intravenously. Full-ring PET imaging was performed from the skull base to thigh after the radiotracer. CT data was obtained and used for attenuation correction and anatomic localization.  FASTING BLOOD GLUCOSE: Value: 85 mg/dl  COMPARISON: CT from  09/30/2015  FINDINGS: NECK  Tiny nodule within the posterior aspect of the right parotid gland measures 8 mm and has an SUV max equal to 3.86.  CHEST  No hypermetabolic mediastinal or hilar nodes. No suspicious pulmonary nodules on the CT scan. Left breast mass containing clip measures 3.3 cm and has an SUV max equal to 22.89. No hypermetabolic axillary, retropectoral, or supraclavicular lymph nodes. No suspicious pulmonary nodules are identified.  ABDOMEN/PELVIS  There is a medium size focus of moderate increased uptake within the anterior spleen. This may at scratch set this has an SUV max equal to 5.63. In retrospect, this corresponds with a 3.3 cm  low-attenuation structure that was not present on 09/30/2015. No suspicious liver abnormality identified. Multiple liver cysts. No abnormal uptake within the pancreas or adrenal glands. No hypermetabolic lymph nodes identified within the upper abdomen or pelvis.  SKELETON  Suspicious and heterogeneous activity is identified throughout the axial and appendicular skeleton. There are more focal areas of increased uptake identified. For example, within the proximal left femur there is a focal area of increased uptake with an SUV max equal 6.4. Within the right iliac bone there is asymmetric increased uptake within SUV max equal to 5.5. Increased heterogeneous activity throughout the spine is also noted.  IMPRESSION: 1. There is intense radiotracer uptake associated with the hypermetabolic left breast mass. 2. Diffuse heterogeneous uptake is identified throughout the axial and appendicular skeleton. The differential considerations for this appearance include diffuse marrow or osseous involvement by tumor, treatment related changes versus anemia. 3. There is an indeterminate area of increased uptake within the anterior spleen corresponding to a well-circumscribed hypo enhancing lesion present on CT from 09/30/2015.  Cannot rule out splenic involvement by tumor.    EXAM: CT CHEST, ABDOMEN, AND PELVIS WITH CONTRAST   09-30-15  TECHNIQUE: Multidetector CT imaging of the chest, abdomen and pelvis was performed following the standard protocol during bolus administration of intravenous contrast.  CONTRAST: 122m ISOVUE-300 IOPAMIDOL (ISOVUE-300) INJECTION 61%  COMPARISON: None.  FINDINGS: CT CHEST FINDINGS  Mediastinum/Lymph Nodes: There is AA large left breast lesion measuring a maximum of 33 mm. A small probable surgical clip markers noted also. No supraclavicular or axillary lymphadenopathy is identified. Small scattered lymph nodes are noted.  The thyroid gland appears normal.  The heart is normal in size. No pericardial effusion. The aorta is normal in caliber. No dissection. Coronary artery calcifications are noted.  No mediastinal or hilar mass or adenopathy. The esophagus is unremarkable.  Lungs/Pleura: No acute pulmonary findings. No worrisome pulmonary lesions to suggest metastatic disease. No pleural effusion.  Musculoskeletal: Moderate degenerative changes involving the thoracic spine. No lytic or blastic bone lesions to suggest metastatic disease.  CT ABDOMEN PELVIS FINDINGS  Hepatobiliary: There are numerous hepatic cysts. A 7.5 cm cyst projecting off the inferior aspect of the right lobe has thin rim-like calcifications. It has mild mass effect on the gallbladder. The other cysts appear simple. No worrisome hepatic lesions to suggest metastatic disease. The gallbladder is grossly normal. No common bile duct dilatation.  Pancreas: No mass, inflammation or ductal dilatation.  Spleen: Normal size. No focal lesions.  Adrenals/Urinary Tract: The adrenal glands demonstrate mild nodularity but no discrete lesion. There are numerous bilateral renal cysts and right renal calculi. Rotation anomalies of both kidneys but no worrisome lesions or  hydronephrosis.  Stomach/Bowel: The stomach, duodenum, small bowel and colon are unremarkable. No inflammatory changes, mass lesions or obstructive findings. The terminal ileum is normal. The appendix is normal. Moderate to advanced diverticulosis involving the descending and sigmoid colon but no findings for acute diverticulitis.  Vascular/Lymphatic: No mesenteric or retroperitoneal mass or adenopathy. The aorta is normal in caliber. Minimal scattered atherosclerotic calcifications. The branch vessels are patent. The major venous structures are patent.  Reproductive: Uterine fibroids are noted with associated calcifications. Knee endometrium is abnormal for the patient's age measuring 229mm. Could not exclude endometrial hyperplasia or cancer. Recommend endometrial sampling.  Both ovaries are normal.  Other: No pelvic mass or lymphadenopathy. No free pelvic fluid collections. The bladder appears normal. No inguinal mass or adenopathy.  Musculoskeletal: No significant bony findings.  IMPRESSION:  1. 3.3 cm left breast mass consistent with known breast cancer. No supraclavicular or axillary lymphadenopathy and no findings for metastatic disease involving the chest, abdomen or pelvis. 2. Numerous benign-appearing hepatic cysts. There are also numerous renal cysts. 3. Endometrial thickening for age. Could not exclude endometrial cancer. Recommend endometrial sampling. Small scattered calcified uterine fibroids are noted. 4. The ovaries are normal. 5. Right renal calculi. 6. No findings suspicious for osseous metastatic disease.       ADDENDUM REPORT: 10/14/2015 12:27 ADDENDUM: Pathology revealed FIBROADENOMA of the Right breast at the 3:00 location. This was found to be concordant by Dr. Lajean Manes. Pathology results were discussed with the patient by telephone. The patient reported doing well after the biopsy with tenderness at the site. Post biopsy instructions  and care were reviewed and questions were answered. The patient was encouraged to call [facility] for any additional concerns. The patient was instructed to return for annual diagnostic mammography at [facility] in [place], [place]. Pathology results reported by Terie Purser, RN on 10/14/2015. Electronically Signed  By: Lajean Manes M.D.  On: 10/14/2015 12:27     Study Result     CLINICAL DATA: Patient has biopsy-proven neuroendocrine carcinoma inner left breast. She presents for stereotactic core needle biopsy of a small right breast mass.  EXAM: RIGHT BREAST STEREOTACTIC CORE NEEDLE BIOPSY  COMPARISON: Previous exams.  FINDINGS: The patient and I discussed the procedure of stereotactic-guided biopsy including benefits and alternatives. We discussed the high likelihood of a successful procedure. We discussed the risks of the procedure including infection, bleeding, tissue injury, clip migration, and inadequate sampling. Informed written consent was given. The usual time out protocol was performed immediately prior to the procedure.  Using sterile technique and 1% Lidocaine as local anesthetic, under stereotactic guidance, a 9 gauge vacuum assist device was used to perform core needle biopsy of the small mass in the medial central aspect of the right breast using a cranial caudal approach. Specimen radiograph was performed showing to round smooth calcifications which were within the small mass. Specimens with calcifications are identified for pathology.  At the conclusion of the procedure, a coil shaped tissue marker clip was deployed into the biopsy cavity. Follow-up 2-view mammogram was performed and dictated separately.  IMPRESSION: Stereotactic-guided biopsy of a small right breast mass. No apparent complications   Outside reports received and will be scanned into this EMR: Bone scan [facility] 10-05-15: symmetric  bilateral anterior chest activity, no specific metastatic pattern, osteoarthritis left knee Mammogram digital diagnostic bilateral  [facility] 09-02-15: dense irregular mass retroareolar left breast at least 3 cm greatest dimension with pleomorphic calcifications and associated left nipple retraction. Additionally an irregular mass inner left breast 9-10 o'clock~ 10 mm and irregular mass in central right breast 1.3 cm. [place] left breast irregular mass with internal vascularity 12 o'clock 1 cm from nipple2.8 x 2.4 x 2.5 cm with associated pleomorphic calcifications extending 5 cm inferomedial to mass. Additional irregular hypoechoic mass left breast at 9 o'clock 0.8 x 0.4 x 0.6 cm. Additional mass right breast has no sonographic correlate. Report of diagnostic left mammmogram post US biopsy 09-09-15   Pathology report from left breast biopsy 09-09-15 not included in this outside information and will be requested from [facility]  Medications: I have reviewed the patient's current medications.  DISCUSSION: Patient feels that she is tolerating chemotherapy very adequately and is in agreement with continuing.  Proceed with cycle 3 of  chemotherapy this week as scheduled. She will receive on pro neulasta on day 3 of this cycle. Follow-up labs in visit on 12-21-2015.   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 Some limited response to first 2 cycles of carbo VP16 per Dr Jacquiline Doe. We will proceed with cycle 3 of her chemotherapy as planned this week. Return visit in 1 week to repeat labs and check toxicity to chemotherapy. 2. Chemo neutropenia despite neulasta cycle 1, chemo thrombocytopenia and anemia requiring PRBCs also cycle 1. Cycle 2 was dose reduced, neulasta given. Follow-up labs next week as scheduled. 3.FIbroadenoma right breast by stereotactic biopsy 10-14-15 4.PAC in 5.family  history of breast cancer in daughter, pancreatic in father, "bone cancer" in mother and prostate cancer in brother 6.dental and gum disease: biotene, may need to ask dental medicine to see 7.osteoarthritis left knee  Chemo orders and on pro orders in place.  Time spent 20 min , including >50% discussion, review of outside records with discussion directly with Dr Jacquiline Doe, and coordination of care. Cc Drs Jacquiline Doe, [place], [place], [place], [place], AGPCNP-BC, AOCNP  12/14/2015 12:34 PM

## 2015-12-15 ENCOUNTER — Other Ambulatory Visit: Payer: Medicare PPO

## 2015-12-15 ENCOUNTER — Other Ambulatory Visit: Payer: Self-pay

## 2015-12-15 ENCOUNTER — Ambulatory Visit (HOSPITAL_COMMUNITY)
Admission: RE | Admit: 2015-12-15 | Discharge: 2015-12-15 | Disposition: A | Discharge status: Home / Self Care | Payer: Medicare PPO | Source: Ambulatory Visit | Attending: Oncology | Admitting: Oncology

## 2015-12-15 ENCOUNTER — Telehealth: Payer: Self-pay

## 2015-12-15 ENCOUNTER — Ambulatory Visit (HOSPITAL_BASED_OUTPATIENT_CLINIC_OR_DEPARTMENT_OTHER): Payer: Medicare PPO

## 2015-12-15 VITALS — BP 151/81 | HR 68 | Temp 98.1°F | Resp 16

## 2015-12-15 DIAGNOSIS — T451X5A Adverse effect of antineoplastic and immunosuppressive drugs, initial encounter: Principal | ICD-10-CM

## 2015-12-15 DIAGNOSIS — Z5111 Encounter for antineoplastic chemotherapy: Secondary | ICD-10-CM

## 2015-12-15 DIAGNOSIS — D6959 Other secondary thrombocytopenia: Secondary | ICD-10-CM

## 2015-12-15 DIAGNOSIS — C801 Malignant (primary) neoplasm, unspecified: Secondary | ICD-10-CM

## 2015-12-15 DIAGNOSIS — C50812 Malignant neoplasm of overlapping sites of left female breast: Secondary | ICD-10-CM

## 2015-12-15 LAB — ABO/RH: ABO/RH(D): O POS

## 2015-12-15 MED ORDER — SODIUM CHLORIDE 0.9 % IV SOLN
10.0000 mg | Freq: Once | INTRAVENOUS | Status: AC
  Administered 2015-12-15: 10 mg via INTRAVENOUS
  Filled 2015-12-15: qty 1

## 2015-12-15 MED ORDER — SODIUM CHLORIDE 0.9 % IV SOLN
60.0000 mg/m2 | Freq: Once | INTRAVENOUS | Status: AC
  Administered 2015-12-15: 120 mg via INTRAVENOUS
  Filled 2015-12-15: qty 6

## 2015-12-15 MED ORDER — SODIUM CHLORIDE 0.9% FLUSH
10.0000 mL | INTRAVENOUS | Status: DC | PRN
  Administered 2015-12-15: 10 mL
  Filled 2015-12-15: qty 10

## 2015-12-15 MED ORDER — HEPARIN SOD (PORK) LOCK FLUSH 100 UNIT/ML IV SOLN
500.0000 [IU] | Freq: Once | INTRAVENOUS | Status: AC | PRN
  Administered 2015-12-15: 500 [IU]
  Filled 2015-12-15: qty 5

## 2015-12-15 MED ORDER — SODIUM CHLORIDE 0.9 % IV SOLN
Freq: Once | INTRAVENOUS | Status: AC
  Administered 2015-12-15: 09:00:00 via INTRAVENOUS

## 2015-12-15 NOTE — Patient Instructions (Signed)
[facility] Discharge Instructions for Patients Receiving Chemotherapy  Today you received the following chemotherapy agents Etoposide.   To help prevent nausea and vomiting after your treatment, we encourage you to take your nausea medication as prescribed.   If you develop nausea and vomiting that is not controlled by your nausea medication, call the clinic.   BELOW ARE SYMPTOMS THAT SHOULD BE REPORTED IMMEDIATELY:  *FEVER GREATER THAN 100.5 F  *CHILLS WITH OR WITHOUT FEVER  NAUSEA AND VOMITING THAT IS NOT CONTROLLED WITH YOUR NAUSEA MEDICATION  *UNUSUAL SHORTNESS OF BREATH  *UNUSUAL BRUISING OR BLEEDING  TENDERNESS IN MOUTH AND THROAT WITH OR WITHOUT PRESENCE OF ULCERS  *URINARY PROBLEMS  *BOWEL PROBLEMS  UNUSUAL RASH Items with * indicate a potential emergency and should be followed up as soon as possible.  Feel free to call the clinic you have any questions or concerns. The clinic phone number is [phone].  Please show the CHEMO ALERT CARD at check-in to the Emergency Department and triage nurse.   

## 2015-12-15 NOTE — Telephone Encounter (Signed)
Infusion time for possible platelets on 12-21-15 has been added to patient's schedule at 1330. If platelets needed 12-21-15,  the blood bank can usually get some if not in "stock" in about an hour.

## 2015-12-15 NOTE — Telephone Encounter (Signed)
-----   Message from [name], MD sent at 12/14/2015  1:11 PM EDT ----- #3 chemo 6-19.20,21. Platelets 112k on 6-19. Needed platelets with earlier cycles done in [place] will need platelets with my apt 6-26 or shortly afterwards that week. Please set up. Has already had POF today from [place], so I did not do POF for this Thank you

## 2015-12-15 NOTE — Progress Notes (Signed)
Dr. Irene Limbo notified of CXR report dated 10/23/2015:  "The catheter tip is noted at the junction of the right subclavian vein and right jugular vein. This is significantly short of its expected location at the cavoatrial junction."  States ok to treat and advises to notify Dr. Marko Plume. Will forward to same.

## 2015-12-15 NOTE — Telephone Encounter (Addendum)
Spoke with Maria Strickland to draw lab ABO/RH to sent to blood bank  prior to or after infusion today of VP-16.  This is to have on file in the [place]  for possible future  Platelet transfusion.  Lab specimen drawn.

## 2015-12-16 ENCOUNTER — Ambulatory Visit (HOSPITAL_BASED_OUTPATIENT_CLINIC_OR_DEPARTMENT_OTHER): Payer: Medicare PPO

## 2015-12-16 VITALS — BP 155/69 | HR 65 | Temp 98.5°F | Resp 18

## 2015-12-16 DIAGNOSIS — Z5111 Encounter for antineoplastic chemotherapy: Secondary | ICD-10-CM | POA: Diagnosis not present

## 2015-12-16 DIAGNOSIS — D701 Agranulocytosis secondary to cancer chemotherapy: Secondary | ICD-10-CM

## 2015-12-16 DIAGNOSIS — C801 Malignant (primary) neoplasm, unspecified: Secondary | ICD-10-CM

## 2015-12-16 DIAGNOSIS — C50812 Malignant neoplasm of overlapping sites of left female breast: Secondary | ICD-10-CM | POA: Diagnosis not present

## 2015-12-16 MED ORDER — SODIUM CHLORIDE 0.9 % IV SOLN
60.0000 mg/m2 | Freq: Once | INTRAVENOUS | Status: AC
  Administered 2015-12-16: 120 mg via INTRAVENOUS
  Filled 2015-12-16: qty 6

## 2015-12-16 MED ORDER — PEGFILGRASTIM 6 MG/0.6ML ~~LOC~~ PSKT
6.0000 mg | PREFILLED_SYRINGE | Freq: Once | SUBCUTANEOUS | Status: AC
  Administered 2015-12-16: 6 mg via SUBCUTANEOUS
  Filled 2015-12-16: qty 0.6

## 2015-12-16 MED ORDER — HEPARIN SOD (PORK) LOCK FLUSH 100 UNIT/ML IV SOLN
500.0000 [IU] | Freq: Once | INTRAVENOUS | Status: AC | PRN
  Administered 2015-12-16: 500 [IU]
  Filled 2015-12-16: qty 5

## 2015-12-16 MED ORDER — SODIUM CHLORIDE 0.9 % IV SOLN
Freq: Once | INTRAVENOUS | Status: AC
  Administered 2015-12-16: 09:00:00 via INTRAVENOUS

## 2015-12-16 MED ORDER — SODIUM CHLORIDE 0.9 % IV SOLN
10.0000 mg | Freq: Once | INTRAVENOUS | Status: AC
  Administered 2015-12-16: 10 mg via INTRAVENOUS
  Filled 2015-12-16: qty 1

## 2015-12-16 MED ORDER — SODIUM CHLORIDE 0.9% FLUSH
10.0000 mL | INTRAVENOUS | Status: DC | PRN
  Administered 2015-12-16: 10 mL
  Filled 2015-12-16: qty 10

## 2015-12-16 NOTE — Patient Instructions (Signed)
[facility] Discharge Instructions for Patients Receiving Chemotherapy  Today you received the following chemotherapy agents: Etoposide   To help prevent nausea and vomiting after your treatment, we encourage you to take your nausea medication as directed.    If you develop nausea and vomiting that is not controlled by your nausea medication, call the clinic.   BELOW ARE SYMPTOMS THAT SHOULD BE REPORTED IMMEDIATELY:  *FEVER GREATER THAN 100.5 F  *CHILLS WITH OR WITHOUT FEVER  NAUSEA AND VOMITING THAT IS NOT CONTROLLED WITH YOUR NAUSEA MEDICATION  *UNUSUAL SHORTNESS OF BREATH  *UNUSUAL BRUISING OR BLEEDING  TENDERNESS IN MOUTH AND THROAT WITH OR WITHOUT PRESENCE OF ULCERS  *URINARY PROBLEMS  *BOWEL PROBLEMS  UNUSUAL RASH Items with * indicate a potential emergency and should be followed up as soon as possible.  Feel free to call the clinic you have any questions or concerns. The clinic phone number is [phone].  Please show the CHEMO ALERT CARD at check-in to the Emergency Department and triage nurse.   

## 2015-12-17 ENCOUNTER — Telehealth: Payer: Self-pay | Admitting: *Deleted

## 2015-12-17 NOTE — Telephone Encounter (Signed)
-----   Message from [place], RN sent at 12/14/2015  9:53 AM EDT ----- Regarding: Dr. Lovie Chol. fu chemo phone call Dr. Marko Plume. Follow chemo phone call. Carbo/VP16. First time chemo in [facility]. On cycle 3.

## 2015-12-17 NOTE — Telephone Encounter (Signed)
Attempted TC to patient s/p 1st Carbo/VP16 here at [facility]. This is actually her 3rd cycle.  No answer and no voice mail capabilities available.

## 2015-12-18 ENCOUNTER — Ambulatory Visit: Payer: Medicare PPO

## 2015-12-20 ENCOUNTER — Other Ambulatory Visit: Payer: Self-pay | Admitting: Oncology

## 2015-12-20 DIAGNOSIS — C801 Malignant (primary) neoplasm, unspecified: Secondary | ICD-10-CM

## 2015-12-21 ENCOUNTER — Telehealth: Payer: Self-pay | Admitting: Oncology

## 2015-12-21 ENCOUNTER — Ambulatory Visit (HOSPITAL_BASED_OUTPATIENT_CLINIC_OR_DEPARTMENT_OTHER): Payer: Medicare PPO | Admitting: Oncology

## 2015-12-21 ENCOUNTER — Ambulatory Visit (HOSPITAL_BASED_OUTPATIENT_CLINIC_OR_DEPARTMENT_OTHER): Payer: Medicare PPO

## 2015-12-21 ENCOUNTER — Encounter: Payer: Self-pay | Admitting: Oncology

## 2015-12-21 ENCOUNTER — Other Ambulatory Visit (HOSPITAL_BASED_OUTPATIENT_CLINIC_OR_DEPARTMENT_OTHER): Payer: Medicare PPO

## 2015-12-21 VITALS — BP 169/92 | HR 85 | Temp 98.2°F | Resp 18 | Ht 63.5 in | Wt 190.4 lb

## 2015-12-21 DIAGNOSIS — Z95828 Presence of other vascular implants and grafts: Secondary | ICD-10-CM

## 2015-12-21 DIAGNOSIS — C50912 Malignant neoplasm of unspecified site of left female breast: Secondary | ICD-10-CM | POA: Diagnosis not present

## 2015-12-21 DIAGNOSIS — Z452 Encounter for adjustment and management of vascular access device: Secondary | ICD-10-CM

## 2015-12-21 DIAGNOSIS — T451X5A Adverse effect of antineoplastic and immunosuppressive drugs, initial encounter: Secondary | ICD-10-CM

## 2015-12-21 DIAGNOSIS — C50812 Malignant neoplasm of overlapping sites of left female breast: Secondary | ICD-10-CM | POA: Diagnosis not present

## 2015-12-21 DIAGNOSIS — C801 Malignant (primary) neoplasm, unspecified: Secondary | ICD-10-CM

## 2015-12-21 DIAGNOSIS — D701 Agranulocytosis secondary to cancer chemotherapy: Secondary | ICD-10-CM

## 2015-12-21 DIAGNOSIS — D6959 Other secondary thrombocytopenia: Secondary | ICD-10-CM | POA: Diagnosis not present

## 2015-12-21 DIAGNOSIS — D6481 Anemia due to antineoplastic chemotherapy: Secondary | ICD-10-CM

## 2015-12-21 LAB — COMPREHENSIVE METABOLIC PANEL
ALK PHOS: 120 U/L (ref 40–150)
ALT: 15 U/L (ref 0–55)
ANION GAP: 6 meq/L (ref 3–11)
AST: 13 U/L (ref 5–34)
Albumin: 3.3 g/dL — ABNORMAL LOW (ref 3.5–5.0)
BUN: 16.2 mg/dL (ref 7.0–26.0)
CALCIUM: 10.1 mg/dL (ref 8.4–10.4)
CHLORIDE: 116 meq/L — AB (ref 98–109)
CO2: 21 mEq/L — ABNORMAL LOW (ref 22–29)
CREATININE: 0.9 mg/dL (ref 0.6–1.1)
EGFR: 74 mL/min/{1.73_m2} — ABNORMAL LOW (ref >90)
Glucose: 90 mg/dl (ref 70–140)
Potassium: 4.4 mEq/L (ref 3.5–5.1)
Sodium: 143 mEq/L (ref 136–145)
Total Bilirubin: 0.82 mg/dL (ref 0.20–1.20)
Total Protein: 6.4 g/dL (ref 6.4–8.3)

## 2015-12-21 LAB — CBC WITH DIFFERENTIAL/PLATELET
BASO%: 0.7 % (ref 0.0–2.0)
BASOS ABS: 0.1 10*3/uL (ref 0.0–0.1)
EOS%: 0.4 % (ref 0.0–7.0)
Eosinophils Absolute: 0 10*3/uL (ref 0.0–0.5)
HEMATOCRIT: 26.8 % — AB (ref 34.8–46.6)
HGB: 9.1 g/dL — ABNORMAL LOW (ref 11.6–15.9)
LYMPH#: 1 10*3/uL (ref 0.9–3.3)
LYMPH%: 9.9 % — ABNORMAL LOW (ref 14.0–49.7)
MCH: 31.1 pg (ref 25.1–34.0)
MCHC: 33.9 g/dL (ref 31.5–36.0)
MCV: 91.9 fL (ref 79.5–101.0)
MONO#: 0.3 10*3/uL (ref 0.1–0.9)
MONO%: 3.2 % (ref 0.0–14.0)
NEUT#: 8.5 10*3/uL — ABNORMAL HIGH (ref 1.5–6.5)
NEUT%: 85.8 % — AB (ref 38.4–76.8)
PLATELETS: 57 10*3/uL — AB (ref 145–400)
RBC: 2.92 10*6/uL — ABNORMAL LOW (ref 3.70–5.45)
RDW: 18.8 % — ABNORMAL HIGH (ref 11.2–14.5)
WBC: 9.9 10*3/uL (ref 3.9–10.3)

## 2015-12-21 LAB — IRON AND TIBC
%SAT: 29 % (ref 21–57)
IRON: 67 ug/dL (ref 41–142)
TIBC: 235 ug/dL — AB (ref 236–444)
UIBC: 168 ug/dL (ref 120–384)

## 2015-12-21 MED ORDER — SODIUM CHLORIDE 0.9 % IJ SOLN
10.0000 mL | INTRAMUSCULAR | Status: DC | PRN
  Administered 2015-12-21: 10 mL via INTRAVENOUS
  Filled 2015-12-21: qty 10

## 2015-12-21 MED ORDER — HEPARIN SOD (PORK) LOCK FLUSH 100 UNIT/ML IV SOLN
500.0000 [IU] | Freq: Once | INTRAVENOUS | Status: AC | PRN
  Administered 2015-12-21: 500 [IU] via INTRAVENOUS
  Filled 2015-12-21: qty 5

## 2015-12-21 NOTE — Progress Notes (Signed)
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 She required transfusions with initial cycles given by Dr Jacquiline Doe.   CXR from [facility] placement reportedly showed PAC tip at right subclavian and right internal jugular veins. PAC has been used for chemo thus far without apparent problems, but needs revision if incorrect location (see discussion)  Patient tells me that she has tolerated most recent chemotherapy generally well. She has had very little nausea, has been a little fatigued, some minimal bruising on UE but no bleeding. The PAC has not been uncomfortable. She thinks the left breast mass may be minimally smaller, no discomfort there. Bowels are moving. No SOB with present exertion. No fever or symptoms of infection. No severe aches from on pro neulasta. No new or different pain. No vaginal yeast symptoms now, tho had this with earlier chemo cycle and resolved well with diflucan. Occasional tingling left lips and left ankle, no other neurologic symptoms.  Remainder of 10 point Review of Systems negative / unchanged.    PAC in by Dr Anthony Sar   ONCOLOGIC HISTORY Patient had not had regular medical care in a number of years when she noticed 2 masses in central left breast in Dec 2016. She was seen by Dr [name] in [place], with bilateral mammograms done 09-02-15 at [facility]. The mammograms, with no priors for comparison, showed in the left breast a dense irregular mass at least 3 cm in greatest dimension, with pleomorphic calcifications extending 5 cm inferomedially in left breast and with associated nipple inversion and retraction; also in the left breast was a 10 mm irregular mass at 9-10 o'clock. In the right breast an  irregular mass centrally slightly medial to nipple 1.3 cm diameter. [place] on left breast showed the mass at 12 o'clock measured 2.8 x 2.4 x 2.5 cm with internal vascularity, and additional hypoechoic mass at 9 o'clock 0.8 x 0.4 x 0.6 cm. In right breast by [place] no suspicious solid or cystic mass identified. 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 Morphologically the neuroendocrine carcinoma is reported to show small to intermediate sized cells with high mitotic count and prominent necrosis, strongly positive for pancytokeratin and negative for chromogranin A, positive for synaptophysin, strongly positive for TTF-1, ER 1%, PR 105, Ki-67 of 96%. She subsequently had biopsy of the right breast lesion ([id number]) at [facility], no malignancy.  Staging studies done as follows:  Bone scan [place] 10-05-15 with bilateral anterior lower chest activity not clearly malignant, symmetrical renal activity, osteoarthritis left knee. CT CAP at [facility] 09-30-15 : Chest no hilar or mediastinal adenopathy, thyroid normal, no pericardial effusion, no pulmonary lesions or pleural effusion, no lytic or blastic skeletal lesions. Abdomen numerous hepatic cysts without evidence of hepatic mets, adrenals mildly nodular, numerous renal cysts without hydronephrosis, spleen without focal lesions, pancreas normal, bowels unremarkable other than diverticulosis descending and sigmoid. No adenopathy. Pelvis no adenopathy, uterine fibroids, endometrium thickened at 23 mm , no ascites, bladder normal . PET [facility] Sidle 9-62-22 no hypermetabolic hilar or Mediastinal nodes, no  pulmonary uptake, left breast mass with SUV 22.89 measuring 3.3 cm without uptake in axillary, retropectoral or supraclavicular nodes. An area of  uptake anterior spleen with SUV max 5.63 corresponding to low density structure not present on 09-30-15, no liver concerns, no other uptake abdomen or pelvis. No mention of endometrial lining. Diffuse heterogeneous uptake thru axial and appendicular skeleton which would be consistent either with diffuse marrow/ bone involvement or anemia. Dr Jacquiline Doe discussed with IR, decision made not to attempt biopsy of spleen.  Dr Jacquiline Doe discussed case with breast oncologist Dr Myra Gianotti Muss and Dr L.Curey. Recommendation was for neoadjuvant carbo VP16 x 4 cycles with consideration of additional anthracyclines, then consideration of mastectomy with axillary node evaluation and further management depending on surgical findings.  Note patient refused MRI brain during initial staging.  Patient received cycle 1 carbo VP16 ~ May 1-2-3, those chemo records not included in outside information. She received neulasta, however developed profound neutropenia without febrile complications, thrombocytopenia and anemia (hgb 7.9) for which she was transfused 2 units PRBCs. Cycle 2 was given 5-22,23,24, reportedly dose reduced from cycle 1. Cycle 2 carbo dose total was 445.5 mg and VP16 118 mg, with decadron 12 mg and Aloxi 0.25 mg. VP16 doses on 5-23 and 5-24 were each 118 mg total , with decadron 12 mg as only antiemetic. Family tells me that she had neulasta also after cycle 2, that information not in these outside records.  Labs 11-16-15 WBC 7.2, ANC 4.8, Hgb 10.2, plt 202k Chemistries date not clear to me (listed as "2 weeks ago") creat 1.01, BUN 15, calcium 9.9, Tprot 6.4, alb 3.65, AP 90, AST 18, Tbili 0.7, ALT 15, Na 145, k 4.3, Cl 111, glu 94  From my direct phone conversation with Dr Jacquiline Doe, the left breast mass has improved only slightly with chemo given thus far. Cycle 3 carbo VP16 given at [facility] 6-19 thru 12-16-15 with neulasta.   Objective:  Vital signs in last 24 hours:  BP 169/92 mmHg  Pulse 85  Temp(Src)  98.2 F (36.8 C) (Oral)  Resp 18  Ht 5' 3.5" (1.613 m)  Wt 190 lb 6.4 oz (86.365 kg)  BMI 33.19 kg/m2  SpO2 100% Weight up 2 lbs. Alert, oriented and appropriate. Ambulatory without assistance.  Alopecia  HEENT:PERRL, sclerae not icteric. Oral mucosa moist without lesions, posterior pharynx clear. Mucous membranes somewhat pale.  Neck supple. No JVD.  Lymphatics:no cervical,supraclavicular, axillary adenopathy Resp: clear to auscultation bilaterally and normal percussion bilaterally Cardio: regular rate and rhythm. No gallop. GI: soft, nontender, not distended, no mass or organomegaly. Normally active bowel sounds.  Musculoskeletal/ Extremities: without pitting edema, cords, tenderness Neuro: no peripheral neuropathy. Otherwise nonfocal Skin without rash, petechiae. < 1 cm resolving ecchymoses scattered UE Breasts: Left with hard mass 1:00 -2:00 with rounded area 3-4 cm, seems ~ stable from my last exam. Nipple some retraction also not changed. No skin changes. Right breast without palpable mass or other findings.  Axillae benign. Portacath-without erythema or tenderness  Lab Results:  Results for orders placed or performed in visit on 12/21/15  CBC with Differential  Result Value Ref Range   WBC 9.9 3.9 - 10.3 10e3/uL   NEUT# 8.5 (H) 1.5 - 6.5 10e3/uL   HGB 9.1 (L) 11.6 - 15.9 g/dL   HCT 26.8 (L) 34.8 - 46.6 %   Platelets 57 (L) 145 - 400 10e3/uL   MCV 91.9 79.5 - 101.0 fL   MCH 31.1 25.1 - 34.0 pg  MCHC 33.9 31.5 - 36.0 g/dL   RBC 2.92 (L) 3.70 - 5.45 10e6/uL   RDW 18.8 (H) 11.2 - 14.5 %   lymph# 1.0 0.9 - 3.3 10e3/uL   MONO# 0.3 0.1 - 0.9 10e3/uL   Eosinophils Absolute 0.0 0.0 - 0.5 10e3/uL   Basophils Absolute 0.1 0.0 - 0.1 10e3/uL   NEUT% 85.8 (H) 38.4 - 76.8 %   LYMPH% 9.9 (L) 14.0 - 49.7 %   MONO% 3.2 0.0 - 14.0 %   EOS% 0.4 0.0 - 7.0 %   BASO% 0.7 0.0 - 2.0 %  Comprehensive metabolic panel  Result Value Ref Range   Sodium 143 136 - 145 mEq/L   Potassium 4.4  3.5 - 5.1 mEq/L   Chloride 116 (H) 98 - 109 mEq/L   CO2 21 (L) 22 - 29 mEq/L   Glucose 90 70 - 140 mg/dl   BUN 16.2 7.0 - 26.0 mg/dL   Creatinine 0.9 0.6 - 1.1 mg/dL   Total Bilirubin 0.82 0.20 - 1.20 mg/dL   Alkaline Phosphatase 120 40 - 150 U/L   AST 13 5 - 34 U/L   ALT 15 0 - 55 U/L   Total Protein 6.4 6.4 - 8.3 g/dL   Albumin 3.3 (L) 3.5 - 5.0 g/dL   Calcium 10.1 8.4 - 10.4 mg/dL   Anion Gap 6 3 - 11 mEq/L   EGFR 74 (L) >90 ml/min/1.73 m2  Iron and TIBC  Result Value Ref Range   Iron 67 41 - 142 ug/dL   TIBC 235 (L) 236 - 444 ug/dL   UIBC 168 120 - 384 ug/dL   %SAT 29 21 - 57 %  Vitamin B12  Result Value Ref Range   Vitamin B12 1,089 (H) 211 - 946 pg/mL   Iron and B12 resulted after visit  Studies/Results:  No results found. Request to HIM for CXR report from [facility] (may be delay in scanning, but not in this EMR now)  Medications: I have reviewed the patient's current medications.  DISCUSSION Does not need platelets today with count >50k and no bleeding, tho may not be at nadir and will need to know that count is ok for [facility] revision. She knows to call if significant bleeding or bruising prior to recheck.   Discussed usual response of small cell cancers to rapidly improve with standard chemo regimen, which is not the response that she has had clinically. Will get CT chest abd within the week; may need repeat PET but will start with CT.  I have requested [facility] pathology review path from [facility] path to request slides and blocks).   Patient tells me that she "wants to stay positive" tho she and daughter understand that we have to be informed about actual situation.   PAC will need revision if still located at right subclavian/ right IJ. Explained to patient that position sometimes changes when patient is upright vs what position appears to be when supine at time of surgical placement. Offered that we ask Dr Anthony Sar to revise, however she prefers to keep care in [place]  now and is in agreement with asking IR to evaluate. Note timing of revision will depend also on counts.   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 Some limited response to first 2 cycles of carbo VP16 per Dr Jacquiline Doe. Cycle 3 given at [facility] June 19-20-21 with neulasta. Clinically has had only minimal  response. Will repeat CT and have outside path reviewed; may need repeat PET. 2. Chemo neutropenia despite neulasta cycle 1, chemo thrombocytopenia and anemia requiring PRBCs also cycle 1. Cycle 2 was dose reduced, neulasta given. Will repeat CBC prior to [facility] revision. 3.PAC: per RN note, report of CXR done at [facility] with [facility] placement showed tip at right subclavian/ right IJ (I cannot locate that report in this EMR).  PAC has been used without difficulty, but will need revision if that is still location of the catheter. Patient requests IR for revision.  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  5.family history of breast cancer in daughter, pancreatic in father, "bone cancer" in mother and prostate cancer in brother 6.dental and gum disease: biotene, may need to ask dental medicine to see 7.osteoarthritis left knee 8.anemia: iron low normal and B12 fine today. Chemo vs marrow involvement or other.  9.chemo neutropenia and chemo thrombocytopenia. Neulasta, following counts 10. Per EMR, she does not have advance directives.   All questions answered and they know to call if concerns prior to next scheduled visit. TIme spent 30 min including >50% counseling and coordination of care.  Dreyden Rohrman P, MD   12/23/2015, 8:17 AM

## 2015-12-21 NOTE — Patient Instructions (Signed)

## 2015-12-21 NOTE — Telephone Encounter (Signed)
appt made and avs printed °

## 2015-12-22 LAB — VITAMIN B12: Vitamin B12: 1089 pg/mL — ABNORMAL HIGH (ref 211–946)

## 2015-12-23 ENCOUNTER — Other Ambulatory Visit: Payer: Self-pay | Admitting: Oncology

## 2015-12-23 ENCOUNTER — Other Ambulatory Visit: Payer: Self-pay | Admitting: Radiology

## 2015-12-23 ENCOUNTER — Telehealth: Payer: Self-pay

## 2015-12-23 ENCOUNTER — Telehealth: Payer: Self-pay | Admitting: Oncology

## 2015-12-23 DIAGNOSIS — C801 Malignant (primary) neoplasm, unspecified: Secondary | ICD-10-CM

## 2015-12-23 NOTE — Telephone Encounter (Signed)
changed lab to 6/29 per pof, cxr are walk in, per pof RN to call pt and explain the reason for changes .

## 2015-12-23 NOTE — Telephone Encounter (Signed)
S/w wife per Dr Marko Plume attached message. Lab appt at 745 am per pt request for early appt.

## 2015-12-23 NOTE — Telephone Encounter (Signed)
-----   Message from [name], MD sent at 12/23/2015  7:55 AM EDT ----- Will need CBC on Thurs 6-29 as PAC revision is set up for 6-30 at 0730 and platelets were low on Mon. Please also get CXR on Thurs, in case position of PAC is different now than what [place] report said - this will confirm if we do need PAC revision as set up for 6-30   Cancel lab on 6-30.  I have sent POF for above as urgent, but RN needs to explain to patient please. Will need to be sure to let patient know lab and CXR information on Thurs  thanks

## 2015-12-24 ENCOUNTER — Other Ambulatory Visit: Payer: Self-pay | Admitting: Oncology

## 2015-12-24 ENCOUNTER — Telehealth: Payer: Self-pay

## 2015-12-24 ENCOUNTER — Ambulatory Visit (HOSPITAL_COMMUNITY)
Admission: RE | Admit: 2015-12-24 | Discharge: 2015-12-24 | Disposition: A | Discharge status: Home / Self Care | Payer: Medicare PPO | Source: Ambulatory Visit | Attending: Oncology | Admitting: Oncology

## 2015-12-24 ENCOUNTER — Other Ambulatory Visit: Payer: Medicare PPO

## 2015-12-24 ENCOUNTER — Other Ambulatory Visit: Payer: Self-pay | Admitting: Radiology

## 2015-12-24 ENCOUNTER — Other Ambulatory Visit (HOSPITAL_BASED_OUTPATIENT_CLINIC_OR_DEPARTMENT_OTHER): Payer: Medicare PPO

## 2015-12-24 DIAGNOSIS — C801 Malignant (primary) neoplasm, unspecified: Secondary | ICD-10-CM

## 2015-12-24 DIAGNOSIS — C50912 Malignant neoplasm of unspecified site of left female breast: Secondary | ICD-10-CM | POA: Diagnosis not present

## 2015-12-24 LAB — CBC WITH DIFFERENTIAL/PLATELET
BASO%: 0.7 % (ref 0.0–2.0)
Basophils Absolute: 0 10*3/uL (ref 0.0–0.1)
EOS ABS: 0 10*3/uL (ref 0.0–0.5)
EOS%: 1 % (ref 0.0–7.0)
HCT: 26.7 % — ABNORMAL LOW (ref 34.8–46.6)
HGB: 9.1 g/dL — ABNORMAL LOW (ref 11.6–15.9)
LYMPH%: 18.4 % (ref 14.0–49.7)
MCH: 31.4 pg (ref 25.1–34.0)
MCHC: 34.1 g/dL (ref 31.5–36.0)
MCV: 92.1 fL (ref 79.5–101.0)
MONO#: 0.4 10*3/uL (ref 0.1–0.9)
MONO%: 8.5 % (ref 0.0–14.0)
NEUT#: 2.9 10*3/uL (ref 1.5–6.5)
NEUT%: 71.4 % (ref 38.4–76.8)
PLATELETS: 60 10*3/uL — AB (ref 145–400)
RBC: 2.9 10*6/uL — AB (ref 3.70–5.45)
RDW: 17.3 % — ABNORMAL HIGH (ref 11.2–14.5)
WBC: 4.1 10*3/uL (ref 3.9–10.3)
lymph#: 0.8 10*3/uL — ABNORMAL LOW (ref 0.9–3.3)

## 2015-12-24 LAB — COMPREHENSIVE METABOLIC PANEL
ALT: 16 U/L (ref 0–55)
ANION GAP: 7 meq/L (ref 3–11)
AST: 12 U/L (ref 5–34)
Albumin: 3.3 g/dL — ABNORMAL LOW (ref 3.5–5.0)
Alkaline Phosphatase: 133 U/L (ref 40–150)
BUN: 10.7 mg/dL (ref 7.0–26.0)
CHLORIDE: 117 meq/L — AB (ref 98–109)
CO2: 22 meq/L (ref 22–29)
Calcium: 10.4 mg/dL (ref 8.4–10.4)
Creatinine: 1.1 mg/dL (ref 0.6–1.1)
EGFR: 61 mL/min/{1.73_m2} — AB (ref >90)
Glucose: 98 mg/dl (ref 70–140)
Potassium: 3.8 mEq/L (ref 3.5–5.1)
Sodium: 146 mEq/L — ABNORMAL HIGH (ref 136–145)
Total Bilirubin: 0.47 mg/dL (ref 0.20–1.20)
Total Protein: 6.6 g/dL (ref 6.4–8.3)

## 2015-12-24 NOTE — Telephone Encounter (Signed)
S/w pt we will be r/s PAC revision, we will attempt to schedule with CT to help with travel issues. She will need a CBC before the port revision. We will be r/s Dr Marko Plume depending on when [facility] done.

## 2015-12-25 ENCOUNTER — Ambulatory Visit (HOSPITAL_COMMUNITY): Admission: RE | Admit: 2015-12-25 | Payer: Medicare PPO | Source: Ambulatory Visit

## 2015-12-25 ENCOUNTER — Other Ambulatory Visit: Payer: Self-pay

## 2015-12-25 ENCOUNTER — Other Ambulatory Visit: Payer: Medicare PPO

## 2015-12-25 ENCOUNTER — Telehealth: Payer: Self-pay | Admitting: Oncology

## 2015-12-25 NOTE — Telephone Encounter (Signed)
s.w. pt and moved 7.3 to 7.6.Marland KitchenMarland KitchenMarland Kitchenpt ok and aware

## 2015-12-26 HISTORY — PX: OTHER SURGICAL HISTORY: SHX169

## 2015-12-28 ENCOUNTER — Other Ambulatory Visit: Payer: Self-pay | Admitting: Oncology

## 2015-12-28 ENCOUNTER — Other Ambulatory Visit: Payer: Medicare PPO

## 2015-12-28 ENCOUNTER — Ambulatory Visit: Payer: Medicare PPO | Admitting: Oncology

## 2015-12-28 ENCOUNTER — Other Ambulatory Visit: Payer: Self-pay | Admitting: Radiology

## 2015-12-28 ENCOUNTER — Telehealth: Payer: Self-pay | Admitting: Oncology

## 2015-12-28 DIAGNOSIS — C801 Malignant (primary) neoplasm, unspecified: Secondary | ICD-10-CM

## 2015-12-28 NOTE — Patient Instructions (Signed)
Arrive in [facility] at 0930, npo after mdnt, have driver

## 2015-12-28 NOTE — Telephone Encounter (Signed)
pt has no voicemail could not leave msg, no answer, lab added to 7/5

## 2015-12-30 ENCOUNTER — Ambulatory Visit (HOSPITAL_COMMUNITY)
Admission: RE | Admit: 2015-12-30 | Discharge: 2015-12-30 | Disposition: A | Discharge status: Home / Self Care | Payer: Medicare PPO | Source: Ambulatory Visit | Attending: Oncology | Admitting: Oncology

## 2015-12-30 ENCOUNTER — Encounter (HOSPITAL_COMMUNITY): Payer: Self-pay

## 2015-12-30 ENCOUNTER — Other Ambulatory Visit: Payer: Self-pay | Admitting: Oncology

## 2015-12-30 ENCOUNTER — Other Ambulatory Visit (HOSPITAL_BASED_OUTPATIENT_CLINIC_OR_DEPARTMENT_OTHER): Payer: Medicare PPO

## 2015-12-30 ENCOUNTER — Telehealth: Payer: Self-pay

## 2015-12-30 DIAGNOSIS — C801 Malignant (primary) neoplasm, unspecified: Secondary | ICD-10-CM

## 2015-12-30 DIAGNOSIS — Z9221 Personal history of antineoplastic chemotherapy: Secondary | ICD-10-CM | POA: Diagnosis not present

## 2015-12-30 DIAGNOSIS — M1712 Unilateral primary osteoarthritis, left knee: Secondary | ICD-10-CM | POA: Diagnosis not present

## 2015-12-30 DIAGNOSIS — C50912 Malignant neoplasm of unspecified site of left female breast: Secondary | ICD-10-CM | POA: Insufficient documentation

## 2015-12-30 DIAGNOSIS — Y712 Prosthetic and other implants, materials and accessory cardiovascular devices associated with adverse incidents: Secondary | ICD-10-CM | POA: Insufficient documentation

## 2015-12-30 DIAGNOSIS — Z452 Encounter for adjustment and management of vascular access device: Secondary | ICD-10-CM | POA: Diagnosis present

## 2015-12-30 DIAGNOSIS — C50812 Malignant neoplasm of overlapping sites of left female breast: Secondary | ICD-10-CM

## 2015-12-30 DIAGNOSIS — D649 Anemia, unspecified: Secondary | ICD-10-CM | POA: Diagnosis not present

## 2015-12-30 DIAGNOSIS — I10 Essential (primary) hypertension: Secondary | ICD-10-CM | POA: Insufficient documentation

## 2015-12-30 DIAGNOSIS — T82524A Displacement of infusion catheter, initial encounter: Secondary | ICD-10-CM | POA: Insufficient documentation

## 2015-12-30 HISTORY — DX: Essential (primary) hypertension: I10

## 2015-12-30 LAB — CBC WITH DIFFERENTIAL/PLATELET
BASO%: 0.6 % (ref 0.0–2.0)
Basophils Absolute: 0 10*3/uL (ref 0.0–0.1)
EOS%: 0.9 % (ref 0.0–7.0)
Eosinophils Absolute: 0 10*3/uL (ref 0.0–0.5)
HCT: 30.6 % — ABNORMAL LOW (ref 34.8–46.6)
HGB: 10.2 g/dL — ABNORMAL LOW (ref 11.6–15.9)
LYMPH%: 20.5 % (ref 14.0–49.7)
MCH: 31.6 pg (ref 25.1–34.0)
MCHC: 33.2 g/dL (ref 31.5–36.0)
MCV: 95.1 fL (ref 79.5–101.0)
MONO#: 0.3 10*3/uL (ref 0.1–0.9)
MONO%: 6.6 % (ref 0.0–14.0)
NEUT%: 71.4 % (ref 38.4–76.8)
NEUTROS ABS: 3.2 10*3/uL (ref 1.5–6.5)
PLATELETS: 100 10*3/uL — AB (ref 145–400)
RBC: 3.22 10*6/uL — AB (ref 3.70–5.45)
RDW: 20.9 % — ABNORMAL HIGH (ref 11.2–14.5)
WBC: 4.5 10*3/uL (ref 3.9–10.3)
lymph#: 0.9 10*3/uL (ref 0.9–3.3)

## 2015-12-30 LAB — PROTIME-INR
INR: 0.98 (ref 0.00–1.49)
PROTHROMBIN TIME: 13.2 s (ref 11.6–15.2)

## 2015-12-30 MED ORDER — CEFAZOLIN SODIUM-DEXTROSE 2-4 GM/100ML-% IV SOLN
2.0000 g | INTRAVENOUS | Status: AC
  Administered 2015-12-30: 2 g via INTRAVENOUS
  Filled 2015-12-30: qty 100

## 2015-12-30 MED ORDER — MIDAZOLAM HCL 2 MG/2ML IJ SOLN
INTRAMUSCULAR | Status: AC
  Filled 2015-12-30: qty 4

## 2015-12-30 MED ORDER — FENTANYL CITRATE (PF) 100 MCG/2ML IJ SOLN
INTRAMUSCULAR | Status: AC
  Filled 2015-12-30: qty 4

## 2015-12-30 MED ORDER — HEPARIN SOD (PORK) LOCK FLUSH 100 UNIT/ML IV SOLN
INTRAVENOUS | Status: AC
  Administered 2015-12-30: 12:00:00
  Filled 2015-12-30: qty 5

## 2015-12-30 MED ORDER — FENTANYL CITRATE (PF) 100 MCG/2ML IJ SOLN
INTRAMUSCULAR | Status: AC | PRN
  Administered 2015-12-30 (×2): 50 ug via INTRAVENOUS

## 2015-12-30 MED ORDER — LIDOCAINE-EPINEPHRINE (PF) 2 %-1:200000 IJ SOLN
INTRAMUSCULAR | Status: AC | PRN
  Administered 2015-12-30: 10 mL via INTRADERMAL

## 2015-12-30 MED ORDER — MIDAZOLAM HCL 2 MG/2ML IJ SOLN
INTRAMUSCULAR | Status: AC | PRN
  Administered 2015-12-30 (×3): 1 mg via INTRAVENOUS

## 2015-12-30 MED ORDER — SODIUM CHLORIDE 0.9 % IV SOLN
INTRAVENOUS | Status: DC
  Administered 2015-12-30: 11:00:00 via INTRAVENOUS

## 2015-12-30 NOTE — Consult Note (Signed)
Chief Complaint: Patient was seen in consultation today for Port-A-Cath revision/possible new placement  Referring Physician(s): Livesay,Lennis P  Supervising Physician: Arne Cleveland  Patient Status: Outpatient  History of Present Illness: Maria Strickland is a 70 y.o. female with history of small cell carcinoma left breast diagnosed in March of this year. She is undergoing chemotherapy. A right chest wall Port-A-Cath was placed by surgery at [facility]. Recent chest x-ray reveals the port tip just above the head of the right clavicle at the junction of the subclavian and IJ vein. She presents today for Port-A-Cath revision or possible new port placement.  Additional past medical history includes hypertension, fibroadenoma the right breast, left knee osteoarthritis, anemia No past surgical history on file.  Allergies: Review of patient's allergies indicates no known allergies.  Medications: Prior to Admission medications   Medication Sig Start Date End Date Taking? Authorizing Provider  acetaminophen (TYLENOL) 500 MG tablet Take 500 mg by mouth every 4 (four) hours as needed.    Historical Provider, MD  atenolol (TENORMIN) 50 MG tablet Take 1 tablet (50 mg total) by mouth daily. 11/30/15   Lennis Marion Downer, MD  Coenzyme Q10 10 MG capsule Take 200 mg by mouth daily.    Historical Provider, MD  Ergocalciferol (VITAMIN D2) 400 units TABS Take 5,000 Units by mouth daily.    Historical Provider, MD  lidocaine-prilocaine (EMLA) cream Apply topically. Apply 1-2 hours prior to [facility] access 10/21/15   Historical Provider, MD  LORazepam (ATIVAN) 1 MG tablet Take 1 mg by mouth every 8 (eight) hours as needed. Take 1 tablet by mouth every 8 hrs as needed for anxiety and at bedtime 11/03/15   Historical Provider, MD  magic mouthwash SOLN Reported on 12/21/2015 11/04/15   Historical Provider, MD  Omega-3 Fatty Acids (FISH OIL) 500 MG CAPS Take 500 mg by mouth 2 (two) times daily. Reported on  12/21/2015    Historical Provider, MD  prochlorperazine (COMPAZINE) 10 MG tablet Take 1 tablet (10 mg total) by mouth every 6 (six) hours as needed for nausea or vomiting. 11/30/15   Lennis Marion Downer, MD     No family history on file.  Social History   Social History  . Marital Status: Married    Spouse Name: N/A  . Number of Children: N/A  . Years of Education: N/A   Social History Main Topics  . Smoking status: Never Smoker   . Smokeless tobacco: Not on file  . Alcohol Use: No  . Drug Use: Not on file  . Sexual Activity: Not on file   Other Topics Concern  . Not on file   Social History Narrative      Review of Systems  Constitutional: Negative for fever and chills.  Respiratory: Negative for cough and shortness of breath.   Cardiovascular: Negative for chest pain.  Gastrointestinal: Negative for nausea, vomiting, abdominal pain and blood in stool.  Genitourinary: Negative for dysuria and hematuria.  Musculoskeletal: Negative for back pain.  Neurological: Negative for headaches.  Psychiatric/Behavioral: The patient is nervous/anxious.     Vital Signs: BP 167/69 mmHg  Pulse 87  Temp(Src) 98.2 F (36.8 C) (Oral)  Resp 18  SpO2 100%  Physical Exam  Constitutional: She is oriented to person, place, and time. She appears well-developed and well-nourished.  Cardiovascular: Normal rate and regular rhythm.   Clean, intact right lateral chest wall Port-A-Cath; site mildly tender to palpation but no erythema or drainage.  Pulmonary/Chest: Effort normal and  breath sounds normal.  Abdominal: Soft. Bowel sounds are normal. There is no tenderness.  Musculoskeletal: Normal range of motion. She exhibits edema.  Neurological: She is alert and oriented to person, place, and time.    Mallampati Score:     Imaging: Dg Chest 2 View  12/24/2015  CLINICAL DATA:  Soreness around Port-A-Cath. Scheduled for Port-A-Cath revision tomorrow. History of small cell carcinoma. History  of breast cancer. EXAM: CHEST  2 VIEW COMPARISON:  Chest CT 09/30/2015 FINDINGS: Right subclavian Port-A-Cath has tip near the junction of the subclavian to internal jugular vein just above the head of the right clavicle. Lungs are adequately inflated without consolidation or effusion. Cardiomediastinal silhouette is within normal. There are mild degenerative changes of the spine. IMPRESSION: No acute cardiopulmonary disease per Right subclavian Port-A-Cath with tip just above the head of the right clavicle likely at the junction of the subclavian to internal jugular vein. Electronically Signed   By: Marin Olp M.D.   On: 12/24/2015 08:31    Labs:  CBC:  Recent Labs  12/14/15 0754 12/21/15 1121 12/24/15 0750 12/30/15 0949  WBC 5.3 9.9 4.1 4.5  HGB 10.4* 9.1* 9.1* 10.2*  HCT 30.8* 26.8* 26.7* 30.6*  PLT 112* 57* 60* 100*    COAGS: No results for input(s): INR, APTT in the last 8760 hours.  BMP:  Recent Labs  11/30/15 1430 12/14/15 0754 12/21/15 1121 12/24/15 0750  NA 145 146* 143 146*  K 4.0 4.1 4.4 3.8  CO2 23 21* 21* 22  GLUCOSE 103 111 90 98  BUN 13.7 17.9 16.2 10.7  CALCIUM 10.4 10.5* 10.1 10.4  CREATININE 1.3* 1.2* 0.9 1.1    LIVER FUNCTION TESTS:  Recent Labs  11/30/15 1430 12/14/15 0754 12/21/15 1121 12/24/15 0750  BILITOT 0.55 0.61 0.82 0.47  AST 12 12 13 12   ALT 10 11 15 16   ALKPHOS 115 74 120 133  PROT 7.0 6.8 6.4 6.6  ALBUMIN 3.5 3.5 3.3* 3.3*    TUMOR MARKERS: No results for input(s): AFPTM, CEA, CA199, CHROMGRNA in the last 8760 hours.  Assessment and Plan: 70 y.o. female with history of small cell carcinoma left breast diagnosed in March of this year. She is undergoing chemotherapy. A right chest wall Port-A-Cath was placed by surgery at [facility].  Recent chest x-ray reveals the port tip just above the head of the right clavicle at the junction of the subclavian and IJ vein. She presents today for Port-A-Cath revision or possible new  port placement.Risks and benefits discussed with the patient/daughter including, but not limited to bleeding, infection, pneumothorax, or fibrin sheath development and need for additional procedures. All of the patient's questions were answered, patient is agreeable to proceed.Consent signed and in chart.      Thank you for this interesting consult.  I greatly enjoyed meeting Kaylina Urso and look forward to participating in their care.  A copy of this report was sent to the requesting provider on this date.  Electronically Signed: D. Rowe Robert 12/30/2015, 10:27 AM   I spent a total of 20 minutes in face to face in clinical consultation, greater than 50% of which was counseling/coordinating care for Port-A-Cath revision/possible new placement

## 2015-12-30 NOTE — Procedures (Signed)
R Port cathter revision with [place] and fluoroscopy No complication No blood loss. See complete dictation in [facility].

## 2015-12-30 NOTE — Discharge Instructions (Signed)
Implanted Port Insertion, Care After Refer to this sheet in the next few weeks. These instructions provide you with information on caring for yourself after your procedure. Your health care provider may also give you more specific instructions. Your treatment has been planned according to current medical practices, but problems sometimes occur. Call your health care provider if you have any problems or questions after your procedure. WHAT TO EXPECT AFTER THE PROCEDURE After your procedure, it is typical to have the following:   Discomfort at the port insertion site. Ice packs to the area will help.  Bruising on the skin over the port. This will subside in 3-4 days. HOME CARE INSTRUCTIONS  After your port is placed, you will get a manufacturer's information card. The card has information about your port. Keep this card with you at all times.   Know what kind of port you have. There are many types of ports available.   Wear a medical alert bracelet in case of an emergency. This can help alert health care workers that you have a port.   The port can stay in for as long as your health care provider believes it is necessary.   A home health care nurse may give medicines and take care of the port.   You or a family member can get special training and directions for giving medicine and taking care of the port at home.  SEEK MEDICAL CARE IF:   Your port does not flush or you are unable to get a blood return.   You have a fever or chills. SEEK IMMEDIATE MEDICAL CARE IF:  You have new fluid or pus coming from your incision.   You notice a bad smell coming from your incision site.   You have swelling, pain, or more redness at the incision or port site.   You have chest pain or shortness of breath.   This information is not intended to replace advice given to you by your health care provider. Make sure you discuss any questions you have with your health care provider.   Document  Released: 04/03/2013 Document Revised: 06/18/2013 Document Reviewed: 04/03/2013 Elsevier Interactive Patient Education 2016 Elsevier Inc. Moderate Conscious Sedation, Adult, Care After Refer to this sheet in the next few weeks. These instructions provide you with information on caring for yourself after your procedure. Your health care provider may also give you more specific instructions. Your treatment has been planned according to current medical practices, but problems sometimes occur. Call your health care provider if you have any problems or questions after your procedure. WHAT TO EXPECT AFTER THE PROCEDURE  After your procedure:  You may feel sleepy, clumsy, and have poor balance for several hours.  Vomiting may occur if you eat too soon after the procedure. HOME CARE INSTRUCTIONS  Do not participate in any activities where you could become injured for at least 24 hours. Do not:  Drive.  Swim.  Ride a bicycle.  Operate heavy machinery.  Cook.  Use power tools.  Climb ladders.  Work from a high place.  Do not make important decisions or sign legal documents until you are improved.  If you vomit, drink water, juice, or soup when you can drink without vomiting. Make sure you have little or no nausea before eating solid foods.  Only take over-the-counter or prescription medicines for pain, discomfort, or fever as directed by your health care provider.  Make sure you and your family fully understand everything about the medicines given  to you, including what side effects may occur.  You should not drink alcohol, take sleeping pills, or take medicines that cause drowsiness for at least 24 hours.  If you smoke, do not smoke without supervision.  If you are feeling better, you may resume normal activities 24 hours after you were sedated.  Keep all appointments with your health care provider. SEEK MEDICAL CARE IF:  Your skin is pale or bluish in color.  You continue to  feel nauseous or vomit.  Your pain is getting worse and is not helped by medicine.  You have bleeding or swelling.  You are still sleepy or feeling clumsy after 24 hours. SEEK IMMEDIATE MEDICAL CARE IF:  You develop a rash.  You have difficulty breathing.  You develop any type of allergic problem.  You have a fever. MAKE SURE YOU:  Understand these instructions.  Will watch your condition.  Will get help right away if you are not doing well or get worse.   This information is not intended to replace advice given to you by your health care provider. Make sure you discuss any questions you have with your health care provider.   Document Released: 04/03/2013 Document Revised: 07/04/2014 Document Reviewed: 04/03/2013 Elsevier Interactive Patient Education 2016 [employer].  health care provider.   Document Released: 03/08/2001 Document Revised: 07/04/2014 Document Reviewed: 02/18/2013 Elsevier Interactive Patient Education [employer].

## 2015-12-30 NOTE — Telephone Encounter (Addendum)
Pt does not have CT until Friday. She is asking if she needs to see Dr Marko Plume on Thursday at 330pm before CT.   She had her port revision today without any problems.

## 2015-12-31 ENCOUNTER — Telehealth: Payer: Self-pay | Admitting: Oncology

## 2015-12-31 ENCOUNTER — Other Ambulatory Visit: Payer: Self-pay | Admitting: Oncology

## 2015-12-31 ENCOUNTER — Other Ambulatory Visit: Payer: Medicare PPO

## 2015-12-31 ENCOUNTER — Ambulatory Visit: Payer: Medicare PPO | Admitting: Oncology

## 2015-12-31 NOTE — Telephone Encounter (Signed)
Medical Oncology  MD spoke with [facility] path, who requested slides from [facility] on 12-25-15, still not received. MD spoke with [facility] path, who tell me that slides were sent overnight on 12-28-15, tracking # [id number] attn Luellen Pucker Tracking # information given to [facility] path now.  Note patient's MD visit is being moved to 01-07-16 to accommodate CT on 7-7 and this path review.  Godfrey Pick, MD

## 2015-12-31 NOTE — Telephone Encounter (Signed)
spoke w/ pt confirmed 7/13 apt times

## 2016-01-01 ENCOUNTER — Ambulatory Visit (HOSPITAL_COMMUNITY)
Admission: RE | Admit: 2016-01-01 | Discharge: 2016-01-01 | Disposition: A | Discharge status: Home / Self Care | Payer: Medicare PPO | Source: Ambulatory Visit | Attending: Oncology | Admitting: Oncology

## 2016-01-01 ENCOUNTER — Encounter (HOSPITAL_COMMUNITY): Payer: Self-pay

## 2016-01-01 DIAGNOSIS — C50912 Malignant neoplasm of unspecified site of left female breast: Secondary | ICD-10-CM | POA: Insufficient documentation

## 2016-01-01 DIAGNOSIS — N200 Calculus of kidney: Secondary | ICD-10-CM | POA: Diagnosis not present

## 2016-01-01 DIAGNOSIS — C801 Malignant (primary) neoplasm, unspecified: Secondary | ICD-10-CM

## 2016-01-01 DIAGNOSIS — I700 Atherosclerosis of aorta: Secondary | ICD-10-CM | POA: Insufficient documentation

## 2016-01-01 DIAGNOSIS — D7389 Other diseases of spleen: Secondary | ICD-10-CM | POA: Insufficient documentation

## 2016-01-01 DIAGNOSIS — I2510 Atherosclerotic heart disease of native coronary artery without angina pectoris: Secondary | ICD-10-CM | POA: Insufficient documentation

## 2016-01-01 MED ORDER — DIATRIZOATE MEGLUMINE & SODIUM 66-10 % PO SOLN
30.0000 mL | Freq: Once | ORAL | Status: AC
  Administered 2016-01-01: 30 mL via ORAL

## 2016-01-01 MED ORDER — IOPAMIDOL (ISOVUE-300) INJECTION 61%
100.0000 mL | Freq: Once | INTRAVENOUS | Status: AC | PRN
  Administered 2016-01-01: 100 mL via INTRAVENOUS

## 2016-01-06 ENCOUNTER — Other Ambulatory Visit: Payer: Self-pay | Admitting: Oncology

## 2016-01-06 ENCOUNTER — Telehealth: Payer: Self-pay | Admitting: Oncology

## 2016-01-06 DIAGNOSIS — C801 Malignant (primary) neoplasm, unspecified: Secondary | ICD-10-CM

## 2016-01-06 NOTE — Telephone Encounter (Signed)
sch appts per LL 7/12 pof. Pt will get updated copy at 7/13 visit date

## 2016-01-07 ENCOUNTER — Telehealth: Payer: Self-pay

## 2016-01-07 ENCOUNTER — Ambulatory Visit (HOSPITAL_BASED_OUTPATIENT_CLINIC_OR_DEPARTMENT_OTHER): Payer: Medicare PPO | Admitting: Oncology

## 2016-01-07 ENCOUNTER — Other Ambulatory Visit (HOSPITAL_BASED_OUTPATIENT_CLINIC_OR_DEPARTMENT_OTHER): Payer: Medicare PPO

## 2016-01-07 ENCOUNTER — Ambulatory Visit: Payer: Medicare PPO

## 2016-01-07 ENCOUNTER — Encounter: Payer: Self-pay | Admitting: Oncology

## 2016-01-07 VITALS — BP 177/85 | HR 76 | Temp 98.9°F | Resp 18 | Ht 63.5 in | Wt 189.3 lb

## 2016-01-07 DIAGNOSIS — D6959 Other secondary thrombocytopenia: Secondary | ICD-10-CM | POA: Diagnosis not present

## 2016-01-07 DIAGNOSIS — C801 Malignant (primary) neoplasm, unspecified: Secondary | ICD-10-CM

## 2016-01-07 DIAGNOSIS — D701 Agranulocytosis secondary to cancer chemotherapy: Secondary | ICD-10-CM

## 2016-01-07 DIAGNOSIS — Z95828 Presence of other vascular implants and grafts: Secondary | ICD-10-CM

## 2016-01-07 DIAGNOSIS — D6481 Anemia due to antineoplastic chemotherapy: Secondary | ICD-10-CM | POA: Diagnosis not present

## 2016-01-07 DIAGNOSIS — I10 Essential (primary) hypertension: Secondary | ICD-10-CM

## 2016-01-07 DIAGNOSIS — C50912 Malignant neoplasm of unspecified site of left female breast: Secondary | ICD-10-CM

## 2016-01-07 DIAGNOSIS — T451X5A Adverse effect of antineoplastic and immunosuppressive drugs, initial encounter: Secondary | ICD-10-CM

## 2016-01-07 LAB — CBC WITH DIFFERENTIAL/PLATELET
BASO%: 0.7 % (ref 0.0–2.0)
Basophils Absolute: 0 10*3/uL (ref 0.0–0.1)
EOS%: 0.2 % (ref 0.0–7.0)
Eosinophils Absolute: 0 10*3/uL (ref 0.0–0.5)
HCT: 33.7 % — ABNORMAL LOW (ref 34.8–46.6)
HGB: 11.3 g/dL — ABNORMAL LOW (ref 11.6–15.9)
LYMPH#: 0.7 10*3/uL — AB (ref 0.9–3.3)
LYMPH%: 11.5 % — AB (ref 14.0–49.7)
MCH: 31.7 pg (ref 25.1–34.0)
MCHC: 33.4 g/dL (ref 31.5–36.0)
MCV: 94.9 fL (ref 79.5–101.0)
MONO#: 0.4 10*3/uL (ref 0.1–0.9)
MONO%: 7 % (ref 0.0–14.0)
NEUT%: 80.6 % — AB (ref 38.4–76.8)
NEUTROS ABS: 5 10*3/uL (ref 1.5–6.5)
PLATELETS: 206 10*3/uL (ref 145–400)
RBC: 3.56 10*6/uL — AB (ref 3.70–5.45)
RDW: 18.6 % — ABNORMAL HIGH (ref 11.2–14.5)
WBC: 6.2 10*3/uL (ref 3.9–10.3)

## 2016-01-07 LAB — COMPREHENSIVE METABOLIC PANEL
ANION GAP: 8 meq/L (ref 3–11)
AST: 10 U/L (ref 5–34)
Albumin: 3.5 g/dL (ref 3.5–5.0)
Alkaline Phosphatase: 79 U/L (ref 40–150)
BILIRUBIN TOTAL: 0.83 mg/dL (ref 0.20–1.20)
BUN: 15.6 mg/dL (ref 7.0–26.0)
CO2: 20 meq/L — AB (ref 22–29)
CREATININE: 1.1 mg/dL (ref 0.6–1.1)
Calcium: 10.7 mg/dL — ABNORMAL HIGH (ref 8.4–10.4)
Chloride: 117 mEq/L — ABNORMAL HIGH (ref 98–109)
EGFR: 59 mL/min/{1.73_m2} — ABNORMAL LOW (ref >90)
GLUCOSE: 118 mg/dL (ref 70–140)
Potassium: 4.1 mEq/L (ref 3.5–5.1)
Sodium: 146 mEq/L — ABNORMAL HIGH (ref 136–145)
TOTAL PROTEIN: 7.1 g/dL (ref 6.4–8.3)

## 2016-01-07 MED ORDER — ATENOLOL 50 MG PO TABS: 50.0000 mg | ORAL_TABLET | Freq: Every day | ORAL | Status: DC

## 2016-01-07 MED ORDER — SODIUM CHLORIDE 0.9 % IJ SOLN
10.0000 mL | INTRAMUSCULAR | Status: DC | PRN
  Filled 2016-01-07: qty 10

## 2016-01-07 MED ORDER — LORAZEPAM 1 MG PO TABS: 1.0000 mg | ORAL_TABLET | Freq: Three times a day (TID) | ORAL | Status: DC | PRN

## 2016-01-07 MED ORDER — HEPARIN SOD (PORK) LOCK FLUSH 100 UNIT/ML IV SOLN
500.0000 [IU] | Freq: Once | INTRAVENOUS | Status: DC | PRN
  Filled 2016-01-07: qty 5

## 2016-01-07 NOTE — Progress Notes (Signed)
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 As patient has not had as rapid or complete response in breast as anticipated, she had restaging CT CAP 01-01-16. Pathology from breast biopsy has also been reviewed by [facility] Pathology.    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 Patient continues to tolerate treatment well, with minimal nausea and only mild fatigue. She denies discomfort from the left breast mass, which she believes is a little smaller than prior to start of chemo. She is eating well, bowels are moving, no new or different pain, no fever or symptoms of infection, no bleeding. New PAC is not uncomfortable.  Remainder of 10 point Review of Systems negative.   PAC replaced by IR on 12-30-15 as previous PAC was not in correct position.     ONCOLOGIC HISTORY Patient had not had regular medical care in a number of years when she noticed 2 masses in central left breast in Dec 2016. She was seen by Dr [name] in [place], with bilateral mammograms done 09-02-15 at [facility]. The mammograms, with no priors for comparison, showed in the left breast a dense irregular mass at least 3 cm in greatest dimension, with pleomorphic calcifications extending 5 cm inferomedially in left breast and with associated nipple inversion and retraction; also in the left breast was a 10 mm irregular mass at 9-10 o'clock. In the right breast an irregular mass centrally slightly medial to nipple 1.3 cm diameter. [place] on left breast showed the mass at 12 o'clock measured 2.8 x  2.4 x 2.5 cm with internal vascularity, and additional hypoechoic mass at 9 o'clock 0.8 x 0.4 x 0.6 cm. In right breast by [place] no suspicious solid or cystic mass identified. 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 Morphologically the neuroendocrine carcinoma is reported to show small to intermediate sized cells with high mitotic count and prominent necrosis, strongly positive for pancytokeratin and negative for chromogranin A, positive for synaptophysin, strongly positive for TTF-1, ER 1%, PR 105, Ki-67 of 96%. She subsequently had biopsy of the right breast lesion ([id number]) at [facility], no malignancy.  Staging studies done as follows:  Bone scan [place] 10-05-15 with bilateral anterior lower chest activity not clearly malignant, symmetrical renal activity, osteoarthritis left knee. CT CAP at [facility] 09-30-15 : Chest no hilar or mediastinal adenopathy, thyroid normal, no pericardial effusion, no pulmonary lesions or pleural effusion, no lytic or blastic skeletal lesions. Abdomen numerous hepatic cysts without evidence of hepatic mets, adrenals mildly nodular, numerous renal cysts without hydronephrosis, spleen without focal lesions, pancreas normal, bowels unremarkable other than diverticulosis descending and sigmoid. No adenopathy. Pelvis no adenopathy, uterine fibroids, endometrium thickened at 23 mm , no ascites, bladder normal . PET Elvina Sidle [id number] no hypermetabolic hilar or Mediastinal nodes, no pulmonary uptake, left breast mass with SUV 22.89 measuring 3.3 cm without uptake in axillary, retropectoral or supraclavicular nodes. An  area of uptake anterior spleen with SUV max 5.63 corresponding to low density structure not present on 09-30-15, no liver concerns, no other  uptake abdomen or pelvis. No mention of endometrial lining. Diffuse heterogeneous uptake thru axial and appendicular skeleton which would be consistent either with diffuse marrow/ bone involvement or anemia. Dr Jacquiline Doe discussed with IR, decision made not to attempt biopsy of spleen.  Dr Jacquiline Doe discussed case with breast oncologist Dr Myra Gianotti Muss and Dr L.Curey. Recommendation was for neoadjuvant carbo VP16 x 4 cycles with consideration of additional anthracyclines, then consideration of mastectomy with axillary node evaluation and further management depending on surgical findings.  Note patient refused MRI brain during initial staging.  Patient received cycle 1 carbo VP16 ~ May 1-2-3, those chemo records not included in outside information. She received neulasta, however developed profound neutropenia without febrile complications, thrombocytopenia and anemia (hgb 7.9) for which she was transfused 2 units PRBCs. Cycle 2 was given 5-22,23,24, reportedly dose reduced from cycle 1. Cycle 2 carbo dose total was 445.5 mg and VP16 118 mg, with decadron 12 mg and Aloxi 0.25 mg. VP16 doses on 5-23 and 5-24 were each 118 mg total , with decadron 12 mg as only antiemetic. Family tells me that she had neulasta also after cycle 2, that information not in these outside records.  Labs 11-16-15 WBC 7.2, ANC 4.8, Hgb 10.2, plt 202k Chemistries date not clear to me (listed as "2 weeks ago") creat 1.01, BUN 15, calcium 9.9, Tprot 6.4, alb 3.65, AP 90, AST 18, Tbili 0.7, ALT 15, Na 145, k 4.3, Cl 111, glu 94  From my direct phone conversation with Dr Jacquiline Doe, the left breast mass has improved only slightly with chemo given thus far. Cycle 3 carbo VP16 given at [facility] 6-19 thru 12-16-15 with neulasta.    Objective:  Vital signs in last 24 hours:  BP 177/85 mmHg  Pulse 76  Temp(Src) 98.9 F (37.2 C) (Oral)  Resp 18  Ht 5' 3.5" (1.613 m)  Wt 189 lb 4.8 oz (85.866 kg)  BMI 33.00 kg/m2  SpO2 100% Weight down  1 lb. Alert, oriented and appropriate, very pleasant as always, not in any apparent discomfort. Ambulatory without difficulty. Respirations not labored.  Alopecia  HEENT:PERRL, sclerae not icteric. Oral mucosa moist without lesions, posterior pharynx clear.  Neck supple. No JVD.  Lymphatics:no cervical,supraclavicular, axillary adenopathy Resp: clear to auscultation bilaterally and normal percussion bilaterally Cardio: regular rate and rhythm. No gallop. GI: soft, nontender, not distended, no mass or organomegaly. Normally active bowel sounds. Musculoskeletal/ Extremities: without pitting edema, cords, tenderness Neuro: no peripheral neuropathy. Otherwise nonfocal Skin without rash, ecchymosis, petechiae Breasts: firm lobular left breast mass not obviously changed, not tender.  Portacath- minimal resolving ecchymosis from recent placement, surgical glue intact, without erythema or unexpected tenderness  Lab Results:  Results for orders placed or performed in visit on 01/07/16  CBC with Differential  Result Value Ref Range   WBC 6.2 3.9 - 10.3 10e3/uL   NEUT# 5.0 1.5 - 6.5 10e3/uL   HGB 11.3 (L) 11.6 - 15.9 g/dL   HCT 33.7 (L) 34.8 - 46.6 %   Platelets 206 145 - 400 10e3/uL   MCV 94.9 79.5 - 101.0 fL   MCH 31.7 25.1 - 34.0 pg   MCHC 33.4 31.5 - 36.0 g/dL   RBC 3.56 (L) 3.70 - 5.45 10e6/uL   RDW 18.6 (H) 11.2 - 14.5 %   lymph# 0.7 (L) 0.9 - 3.3 10e3/uL   MONO#  0.4 0.1 - 0.9 10e3/uL   Eosinophils Absolute 0.0 0.0 - 0.5 10e3/uL   Basophils Absolute 0.0 0.0 - 0.1 10e3/uL   NEUT% 80.6 (H) 38.4 - 76.8 %   LYMPH% 11.5 (L) 14.0 - 49.7 %   MONO% 7.0 0.0 - 14.0 %   EOS% 0.2 0.0 - 7.0 %   BASO% 0.7 0.0 - 2.0 %    CMET today normal with exception of Na 146, Cl 117, CO2 20, Calcium 10.7 (alb 3.5)  Studies/Results:  EXAM: CT CHEST AND ABDOMEN WITH CONTRAST  TECHNIQUE: Multidetector CT imaging of the chest and abdomen was performed following the standard protocol during bolus  administration of intravenous contrast.  CONTRAST: 171m ISOVUE-300 IOPAMIDOL (ISOVUE-300) INJECTION 61%  COMPARISON: CTs of 09/30/2015. PET of 10/08/2015.  FINDINGS: CT CHEST WITH CONTRAST  Mediastinum/Lymph Nodes: No supraclavicular adenopathy. A right Port-A-Cath terminates at the superior caval/ atrial junction.  No axillary adenopathy. No subpectoral adenopathy.  Lateral left breast mass measures 2.8 x 2.6 cm today versus 3.3 x 2.6 cm previously. Tortuous thoracic aorta. Thoracic aortic atherosclerosis. Borderline cardiomegaly, with trace anterior pericardial fluid, physiologic. Left main coronary artery atherosclerosis on image 28/series 2. No central pulmonary embolism, on this non-dedicated study. No mediastinal or hilar adenopathy. No internal mammary adenopathy.  Lungs/Pleura: No pleural fluid. Clear lungs.  Musculoskeletal: No acute osseous abnormality.  CT ABDOMEN WITH CONTRAST  Hepatobiliary: Large hepatic cysts are again identified. An exophytic minimally complex right-sided lesion is again identified on the order of 8.4 cm. No hepatic metastasis. Normal gallbladder, without biliary ductal dilatation.  Pancreas: Normal, without mass or ductal dilatation.  Spleen: anterior splenic lesion measures 3.1 x 2.9 cm today versus on the order of 3.3 x 2.7 cm previously. Suggests stability.  Adrenals/Urinary Tract: Normal adrenal glands. Bilateral malrotated kidneys. Suspect a punctate right renal collecting system calculus. Right renal cyst. Bilateral too small to characterize renal lesions. No hydronephrosis.  Stomach/Bowel: Normal stomach, without wall thickening. Normal abdominal portions of the colon, terminal ileum, and appendix. Normal abdominal small bowel.  Vascular/Lymphatic: Abdominal aortic and branch vessel atherosclerosis. No retroperitoneal or retrocrural adenopathy.  Other: No ascites. No evidence of omental or peritoneal  disease.  Musculoskeletal: No acute osseous abnormality.  IMPRESSION: 1. Decrease in size of left breast mass. 2. No evidence of metastatic disease within the chest. 3. Anterior splenic lesion is similar in size. As isolated splenic metastasis are extremely rare, an incidental splenic primary lesion is favored. Question hamartoma. Malignant etiologies cannot be excluded, given hypermetabolism on PET. Recommend attention on follow-up. 4. Right nephrolithiasis. 5. Coronary artery atherosclerosis, including within the left main. Aortic atherosclerosis.  PACs images reviewed by MD. Patient and husband did not wish to see  Images as we discussed the information    PATHOLOGY [facility] of outside [facility] FINAL for BJAYMES, REVELS(([id number] Patient: Maria Strickland, BELLICollected: 01/01/2016 [facility] [id number]: 01/01/2016 LEvlyn Clines MD REPORT OF SURGICAL PATHOLOGY ADDITIONAL INFORMATION: By immunohistochemistry, the tumor cells are negative for Her2 (1). JEnid CutterMD Pathologist, Electronic Signature ( Signed 01/04/2016) FINAL DIAGNOSIS Diagnosis Consult- Comprehensive, [id number](A1) Breast biopsy Left 12:00 - HIGH GRADE CARCINOMA WITH NEUROENDOCRINE FEATURES. - SEE COMMENT. Microscopic Comment Immunohistochemical stains performed here reveal that the malignant cells are positive for CD56, focally positive for NSE, and focally positive for estrogen receptor. They are negative for GCDFP. Immunohistochemical stains performed elsewhere reveal that the malignant cells are positive for keratin, TTF-1 and synaptophysin. They are negative for  chromogranin A. This histology, in conjunction with this immunohistochemical profile is consistent with a high grade carcinoma with neuroendocrine features, likely of lung origin. (JBK:kh 01-04-16) Enid Cutter MD    Medications: I have reviewed the patient's current  medications. She does not have PCP. Will renew tenormin 50 mg daily which was begun by gyn physician when she presented with this illness, 3 month supply with low cost from her pharmacy. Refill ativan.  DISCUSSION As this is the first time that I have met her husband, we have reviewed course to this point, as well as discussing findings on restaging scans and of the pathology review as above. I have explained that small cell carcinomas are unusual but can be primary in areas other than lung, and that these malignancies generally respond more quickly and completely than the response that she has had to treatment thus far. I have recommended that she have cycle 4 as planned beginning 01-11-16, then PET. If she has distant disease, likely will be best to continue systemic treatment rather than surgery on the breast, however if disease is limited would consider surgery. Patient prefers surgery in [place] if done.  Patient and husband seem to follow discussion well; patient tells me that she tends to worry and that it is better for her to stay hopeful, tho we will still be honest about situation ongoing.    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 Some limited response to first 2 cycles of carbo VP16 per Dr Jacquiline Doe. Cycle 3 given at [facility] 19-20-21 with neulasta. Clinically and by CT only partial response of the breast mass. Will give cycle 4 same regimen, then PET. If no distant disease will ask surgery to see; if not limited disease will likely need further systemic treatment. 2. Chemo neutropenia despite neulasta cycle 1, chemo thrombocytopenia and anemia requiring PRBCs also cycle 1. Cycle 2 was dose reduced, neulasta given.  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  5.family history of breast cancer in daughter,  pancreatic in father, "bone cancer" in mother and prostate cancer in brother 6.dental and gum disease: biotene, may need to ask dental medicine to see 7.osteoarthritis left knee 8.anemia: iron low normal and B12 fine . Chemo vs marrow involvement or other.  9.chemo neutropenia and chemo thrombocytopenia. Neulasta, following counts 10. Per EMR, she does not have advance directives.  11.hypertension: untreated at presentation to gyn Dr Evie Lacks. Continue tenormin. Patient aware that she should establish with a PCP.  All questions answered and they know to call if any concerns between scheduled visits. Chemo and on pro neulasta orders placed. PET ordered, message to managed care.    Evlyn Clines, MD   01/07/2016, 9:22 AM

## 2016-01-07 NOTE — Progress Notes (Signed)
Pt refused port access for lab draw.

## 2016-01-07 NOTE — Telephone Encounter (Signed)
-----   Message from [name], MD sent at 01/07/2016  9:51 AM EDT ----- Please refill her BP med x 90 day supply, which her pharmacy told her would be fine Also refill ativan  thanks

## 2016-01-09 DIAGNOSIS — I10 Essential (primary) hypertension: Secondary | ICD-10-CM | POA: Insufficient documentation

## 2016-01-10 ENCOUNTER — Other Ambulatory Visit: Payer: Self-pay | Admitting: Oncology

## 2016-01-11 ENCOUNTER — Other Ambulatory Visit (HOSPITAL_BASED_OUTPATIENT_CLINIC_OR_DEPARTMENT_OTHER): Payer: Medicare PPO

## 2016-01-11 ENCOUNTER — Ambulatory Visit: Payer: Medicare PPO

## 2016-01-11 ENCOUNTER — Ambulatory Visit (HOSPITAL_BASED_OUTPATIENT_CLINIC_OR_DEPARTMENT_OTHER): Payer: Medicare PPO

## 2016-01-11 VITALS — BP 179/93 | HR 65 | Temp 98.1°F | Resp 18

## 2016-01-11 DIAGNOSIS — Z5111 Encounter for antineoplastic chemotherapy: Secondary | ICD-10-CM | POA: Diagnosis not present

## 2016-01-11 DIAGNOSIS — C50812 Malignant neoplasm of overlapping sites of left female breast: Secondary | ICD-10-CM

## 2016-01-11 DIAGNOSIS — C801 Malignant (primary) neoplasm, unspecified: Secondary | ICD-10-CM

## 2016-01-11 LAB — COMPREHENSIVE METABOLIC PANEL
ALK PHOS: 79 U/L (ref 40–150)
AST: 10 U/L (ref 5–34)
Albumin: 3.6 g/dL (ref 3.5–5.0)
Anion Gap: 6 mEq/L (ref 3–11)
BILIRUBIN TOTAL: 0.68 mg/dL (ref 0.20–1.20)
BUN: 18.9 mg/dL (ref 7.0–26.0)
CALCIUM: 10.6 mg/dL — AB (ref 8.4–10.4)
CO2: 24 meq/L (ref 22–29)
CREATININE: 1.2 mg/dL — AB (ref 0.6–1.1)
Chloride: 115 mEq/L — ABNORMAL HIGH (ref 98–109)
EGFR: 54 mL/min/{1.73_m2} — ABNORMAL LOW (ref >90)
GLUCOSE: 95 mg/dL (ref 70–140)
Potassium: 4.6 mEq/L (ref 3.5–5.1)
Sodium: 145 mEq/L (ref 136–145)
TOTAL PROTEIN: 6.9 g/dL (ref 6.4–8.3)

## 2016-01-11 LAB — CBC WITH DIFFERENTIAL/PLATELET
BASO%: 0.6 % (ref 0.0–2.0)
Basophils Absolute: 0 10*3/uL (ref 0.0–0.1)
EOS%: 0.6 % (ref 0.0–7.0)
Eosinophils Absolute: 0 10*3/uL (ref 0.0–0.5)
HEMATOCRIT: 33.9 % — AB (ref 34.8–46.6)
HEMOGLOBIN: 11.4 g/dL — AB (ref 11.6–15.9)
LYMPH#: 1 10*3/uL (ref 0.9–3.3)
LYMPH%: 19.6 % (ref 14.0–49.7)
MCH: 31.9 pg (ref 25.1–34.0)
MCHC: 33.6 g/dL (ref 31.5–36.0)
MCV: 95 fL (ref 79.5–101.0)
MONO#: 0.4 10*3/uL (ref 0.1–0.9)
MONO%: 6.8 % (ref 0.0–14.0)
NEUT%: 72.4 % (ref 38.4–76.8)
NEUTROS ABS: 3.7 10*3/uL (ref 1.5–6.5)
PLATELETS: 192 10*3/uL (ref 145–400)
RBC: 3.57 10*6/uL — ABNORMAL LOW (ref 3.70–5.45)
RDW: 17.7 % — AB (ref 11.2–14.5)
WBC: 5.2 10*3/uL (ref 3.9–10.3)

## 2016-01-11 MED ORDER — SODIUM CHLORIDE 0.9 % IV SOLN
342.8000 mg | Freq: Once | INTRAVENOUS | Status: AC
  Administered 2016-01-11: 340 mg via INTRAVENOUS
  Filled 2016-01-11: qty 34

## 2016-01-11 MED ORDER — SODIUM CHLORIDE 0.9% FLUSH
10.0000 mL | INTRAVENOUS | Status: DC | PRN
  Administered 2016-01-11: 10 mL
  Filled 2016-01-11: qty 10

## 2016-01-11 MED ORDER — PALONOSETRON HCL INJECTION 0.25 MG/5ML
INTRAVENOUS | Status: AC
  Filled 2016-01-11: qty 5

## 2016-01-11 MED ORDER — SODIUM CHLORIDE 0.9 % IV SOLN
Freq: Once | INTRAVENOUS | Status: AC
Start: 2016-01-11 — End: 2016-01-11
  Administered 2016-01-11: 11:00:00 via INTRAVENOUS

## 2016-01-11 MED ORDER — ETOPOSIDE CHEMO INJECTION 1 GM/50ML
60.0000 mg/m2 | Freq: Once | INTRAVENOUS | Status: AC
  Administered 2016-01-11: 120 mg via INTRAVENOUS
  Filled 2016-01-11: qty 6

## 2016-01-11 MED ORDER — HEPARIN SOD (PORK) LOCK FLUSH 100 UNIT/ML IV SOLN
500.0000 [IU] | Freq: Once | INTRAVENOUS | Status: AC | PRN
  Administered 2016-01-11: 500 [IU]
  Filled 2016-01-11: qty 5

## 2016-01-11 MED ORDER — PALONOSETRON HCL INJECTION 0.25 MG/5ML
0.2500 mg | Freq: Once | INTRAVENOUS | Status: AC
  Administered 2016-01-11: 0.25 mg via INTRAVENOUS

## 2016-01-11 MED ORDER — SODIUM CHLORIDE 0.9 % IV SOLN
10.0000 mg | Freq: Once | INTRAVENOUS | Status: AC
  Administered 2016-01-11: 10 mg via INTRAVENOUS
  Filled 2016-01-11: qty 1

## 2016-01-11 NOTE — Patient Instructions (Signed)
[facility] Discharge Instructions for Patients Receiving Chemotherapy  Today you received the following chemotherapy agents: Etoposide   To help prevent nausea and vomiting after your treatment, we encourage you to take your nausea medication as directed.    If you develop nausea and vomiting that is not controlled by your nausea medication, call the clinic.   BELOW ARE SYMPTOMS THAT SHOULD BE REPORTED IMMEDIATELY:  *FEVER GREATER THAN 100.5 F  *CHILLS WITH OR WITHOUT FEVER  NAUSEA AND VOMITING THAT IS NOT CONTROLLED WITH YOUR NAUSEA MEDICATION  *UNUSUAL SHORTNESS OF BREATH  *UNUSUAL BRUISING OR BLEEDING  TENDERNESS IN MOUTH AND THROAT WITH OR WITHOUT PRESENCE OF ULCERS  *URINARY PROBLEMS  *BOWEL PROBLEMS  UNUSUAL RASH Items with * indicate a potential emergency and should be followed up as soon as possible.  Feel free to call the clinic you have any questions or concerns. The clinic phone number is [phone].  Please show the CHEMO ALERT CARD at check-in to the Emergency Department and triage nurse.   

## 2016-01-12 ENCOUNTER — Ambulatory Visit (HOSPITAL_BASED_OUTPATIENT_CLINIC_OR_DEPARTMENT_OTHER): Payer: Medicare PPO

## 2016-01-12 VITALS — BP 183/90 | HR 71 | Temp 98.1°F | Resp 18

## 2016-01-12 DIAGNOSIS — C50912 Malignant neoplasm of unspecified site of left female breast: Secondary | ICD-10-CM

## 2016-01-12 DIAGNOSIS — Z5111 Encounter for antineoplastic chemotherapy: Secondary | ICD-10-CM

## 2016-01-12 DIAGNOSIS — C801 Malignant (primary) neoplasm, unspecified: Secondary | ICD-10-CM

## 2016-01-12 MED ORDER — SODIUM CHLORIDE 0.9 % IV SOLN
Freq: Once | INTRAVENOUS | Status: AC
  Administered 2016-01-12: 11:00:00 via INTRAVENOUS

## 2016-01-12 MED ORDER — SODIUM CHLORIDE 0.9 % IV SOLN
60.0000 mg/m2 | Freq: Once | INTRAVENOUS | Status: AC
  Administered 2016-01-12: 120 mg via INTRAVENOUS
  Filled 2016-01-12: qty 6

## 2016-01-12 MED ORDER — HEPARIN SOD (PORK) LOCK FLUSH 100 UNIT/ML IV SOLN
500.0000 [IU] | Freq: Once | INTRAVENOUS | Status: AC | PRN
  Administered 2016-01-12: 500 [IU]
  Filled 2016-01-12: qty 5

## 2016-01-12 MED ORDER — SODIUM CHLORIDE 0.9 % IV SOLN
10.0000 mg | Freq: Once | INTRAVENOUS | Status: AC
  Administered 2016-01-12: 10 mg via INTRAVENOUS
  Filled 2016-01-12: qty 1

## 2016-01-12 MED ORDER — SODIUM CHLORIDE 0.9% FLUSH
10.0000 mL | INTRAVENOUS | Status: DC | PRN
  Administered 2016-01-12: 10 mL
  Filled 2016-01-12: qty 10

## 2016-01-12 NOTE — Patient Instructions (Addendum)
[facility] Discharge Instructions for Patients Receiving Chemotherapy  Today you received the following chemotherapy agents: Etoposide   To help prevent nausea and vomiting after your treatment, we encourage you to take your nausea medication as directed.    If you develop nausea and vomiting that is not controlled by your nausea medication, call the clinic.   BELOW ARE SYMPTOMS THAT SHOULD BE REPORTED IMMEDIATELY:  *FEVER GREATER THAN 100.5 F  *CHILLS WITH OR WITHOUT FEVER  NAUSEA AND VOMITING THAT IS NOT CONTROLLED WITH YOUR NAUSEA MEDICATION  *UNUSUAL SHORTNESS OF BREATH  *UNUSUAL BRUISING OR BLEEDING  TENDERNESS IN MOUTH AND THROAT WITH OR WITHOUT PRESENCE OF ULCERS  *URINARY PROBLEMS  *BOWEL PROBLEMS  UNUSUAL RASH Items with * indicate a potential emergency and should be followed up as soon as possible.  Feel free to call the clinic you have any questions or concerns. The clinic phone number is [phone].  Please show the CHEMO ALERT CARD at check-in to the Emergency Department and triage nurse.   

## 2016-01-13 ENCOUNTER — Ambulatory Visit (HOSPITAL_BASED_OUTPATIENT_CLINIC_OR_DEPARTMENT_OTHER): Payer: Medicare PPO

## 2016-01-13 VITALS — BP 140/80 | HR 60 | Temp 98.0°F | Resp 19

## 2016-01-13 DIAGNOSIS — D701 Agranulocytosis secondary to cancer chemotherapy: Secondary | ICD-10-CM

## 2016-01-13 DIAGNOSIS — C50912 Malignant neoplasm of unspecified site of left female breast: Secondary | ICD-10-CM | POA: Diagnosis not present

## 2016-01-13 DIAGNOSIS — Z5111 Encounter for antineoplastic chemotherapy: Secondary | ICD-10-CM | POA: Diagnosis not present

## 2016-01-13 DIAGNOSIS — C801 Malignant (primary) neoplasm, unspecified: Secondary | ICD-10-CM

## 2016-01-13 MED ORDER — HEPARIN SOD (PORK) LOCK FLUSH 100 UNIT/ML IV SOLN
500.0000 [IU] | Freq: Once | INTRAVENOUS | Status: AC | PRN
  Administered 2016-01-13: 500 [IU]
  Filled 2016-01-13: qty 5

## 2016-01-13 MED ORDER — SODIUM CHLORIDE 0.9% FLUSH
10.0000 mL | INTRAVENOUS | Status: DC | PRN
  Administered 2016-01-13: 10 mL
  Filled 2016-01-13: qty 10

## 2016-01-13 MED ORDER — SODIUM CHLORIDE 0.9 % IV SOLN
Freq: Once | INTRAVENOUS | Status: AC
  Administered 2016-01-13: 11:00:00 via INTRAVENOUS

## 2016-01-13 MED ORDER — PEGFILGRASTIM 6 MG/0.6ML ~~LOC~~ PSKT
6.0000 mg | PREFILLED_SYRINGE | Freq: Once | SUBCUTANEOUS | Status: AC
  Administered 2016-01-13: 6 mg via SUBCUTANEOUS
  Filled 2016-01-13: qty 0.6

## 2016-01-13 MED ORDER — SODIUM CHLORIDE 0.9 % IV SOLN
10.0000 mg | Freq: Once | INTRAVENOUS | Status: AC
  Administered 2016-01-13: 10 mg via INTRAVENOUS
  Filled 2016-01-13: qty 1

## 2016-01-13 MED ORDER — SODIUM CHLORIDE 0.9 % IV SOLN
60.0000 mg/m2 | Freq: Once | INTRAVENOUS | Status: AC
  Administered 2016-01-13: 120 mg via INTRAVENOUS
  Filled 2016-01-13: qty 6

## 2016-01-13 NOTE — Patient Instructions (Signed)
[facility] Discharge Instructions for Patients Receiving Chemotherapy  Today you received the following chemotherapy agents: Etoposide   To help prevent nausea and vomiting after your treatment, we encourage you to take your nausea medication as directed.    If you develop nausea and vomiting that is not controlled by your nausea medication, call the clinic.   BELOW ARE SYMPTOMS THAT SHOULD BE REPORTED IMMEDIATELY:  *FEVER GREATER THAN 100.5 F  *CHILLS WITH OR WITHOUT FEVER  NAUSEA AND VOMITING THAT IS NOT CONTROLLED WITH YOUR NAUSEA MEDICATION  *UNUSUAL SHORTNESS OF BREATH  *UNUSUAL BRUISING OR BLEEDING  TENDERNESS IN MOUTH AND THROAT WITH OR WITHOUT PRESENCE OF ULCERS  *URINARY PROBLEMS  *BOWEL PROBLEMS  UNUSUAL RASH Items with * indicate a potential emergency and should be followed up as soon as possible.  Feel free to call the clinic you have any questions or concerns. The clinic phone number is [phone].  Please show the CHEMO ALERT CARD at check-in to the Emergency Department and triage nurse.   

## 2016-01-25 ENCOUNTER — Ambulatory Visit (HOSPITAL_COMMUNITY)
Admission: RE | Admit: 2016-01-25 | Discharge: 2016-01-25 | Disposition: A | Discharge status: Home / Self Care | Payer: Medicare PPO | Source: Ambulatory Visit | Attending: Oncology | Admitting: Oncology

## 2016-01-25 DIAGNOSIS — C801 Malignant (primary) neoplasm, unspecified: Secondary | ICD-10-CM | POA: Insufficient documentation

## 2016-01-25 DIAGNOSIS — N63 Unspecified lump in breast: Secondary | ICD-10-CM | POA: Insufficient documentation

## 2016-01-25 DIAGNOSIS — K3189 Other diseases of stomach and duodenum: Secondary | ICD-10-CM | POA: Insufficient documentation

## 2016-01-25 LAB — GLUCOSE, CAPILLARY: GLUCOSE-CAPILLARY: 121 mg/dL — AB (ref 65–99)

## 2016-01-25 MED ORDER — FLUDEOXYGLUCOSE F - 18 (FDG) INJECTION
10.4000 | Freq: Once | INTRAVENOUS | Status: AC | PRN
  Administered 2016-01-25: 10.4 via INTRAVENOUS

## 2016-01-28 ENCOUNTER — Ambulatory Visit (HOSPITAL_BASED_OUTPATIENT_CLINIC_OR_DEPARTMENT_OTHER): Payer: Medicare PPO | Admitting: Oncology

## 2016-01-28 ENCOUNTER — Other Ambulatory Visit: Payer: Self-pay | Admitting: Oncology

## 2016-01-28 ENCOUNTER — Ambulatory Visit (HOSPITAL_BASED_OUTPATIENT_CLINIC_OR_DEPARTMENT_OTHER): Payer: Medicare PPO

## 2016-01-28 ENCOUNTER — Encounter: Payer: Self-pay | Admitting: Oncology

## 2016-01-28 ENCOUNTER — Other Ambulatory Visit (HOSPITAL_BASED_OUTPATIENT_CLINIC_OR_DEPARTMENT_OTHER): Payer: Medicare PPO

## 2016-01-28 VITALS — BP 141/87 | HR 79 | Temp 98.3°F | Resp 18 | Ht 63.5 in | Wt 189.9 lb

## 2016-01-28 DIAGNOSIS — C50812 Malignant neoplasm of overlapping sites of left female breast: Secondary | ICD-10-CM

## 2016-01-28 DIAGNOSIS — D6481 Anemia due to antineoplastic chemotherapy: Secondary | ICD-10-CM | POA: Diagnosis not present

## 2016-01-28 DIAGNOSIS — Z95828 Presence of other vascular implants and grafts: Secondary | ICD-10-CM

## 2016-01-28 DIAGNOSIS — Z452 Encounter for adjustment and management of vascular access device: Secondary | ICD-10-CM

## 2016-01-28 DIAGNOSIS — C801 Malignant (primary) neoplasm, unspecified: Secondary | ICD-10-CM

## 2016-01-28 DIAGNOSIS — I10 Essential (primary) hypertension: Secondary | ICD-10-CM

## 2016-01-28 DIAGNOSIS — T451X5A Adverse effect of antineoplastic and immunosuppressive drugs, initial encounter: Secondary | ICD-10-CM

## 2016-01-28 DIAGNOSIS — D701 Agranulocytosis secondary to cancer chemotherapy: Secondary | ICD-10-CM | POA: Diagnosis not present

## 2016-01-28 DIAGNOSIS — D6959 Other secondary thrombocytopenia: Secondary | ICD-10-CM | POA: Diagnosis not present

## 2016-01-28 DIAGNOSIS — K2970 Gastritis, unspecified, without bleeding: Secondary | ICD-10-CM

## 2016-01-28 DIAGNOSIS — C50912 Malignant neoplasm of unspecified site of left female breast: Secondary | ICD-10-CM

## 2016-01-28 LAB — CBC WITH DIFFERENTIAL/PLATELET
BASO%: 0.4 % (ref 0.0–2.0)
Basophils Absolute: 0 10*3/uL (ref 0.0–0.1)
EOS%: 0.4 % (ref 0.0–7.0)
Eosinophils Absolute: 0 10*3/uL (ref 0.0–0.5)
HCT: 32.3 % — ABNORMAL LOW (ref 34.8–46.6)
HEMOGLOBIN: 10.7 g/dL — AB (ref 11.6–15.9)
LYMPH%: 13.3 % — ABNORMAL LOW (ref 14.0–49.7)
MCH: 31.5 pg (ref 25.1–34.0)
MCHC: 33.3 g/dL (ref 31.5–36.0)
MCV: 94.7 fL (ref 79.5–101.0)
MONO#: 0.5 10*3/uL (ref 0.1–0.9)
MONO%: 6.4 % (ref 0.0–14.0)
NEUT%: 79.5 % — ABNORMAL HIGH (ref 38.4–76.8)
NEUTROS ABS: 6.4 10*3/uL (ref 1.5–6.5)
Platelets: 134 10*3/uL — ABNORMAL LOW (ref 145–400)
RBC: 3.41 10*6/uL — ABNORMAL LOW (ref 3.70–5.45)
RDW: 17.1 % — AB (ref 11.2–14.5)
WBC: 8 10*3/uL (ref 3.9–10.3)
lymph#: 1.1 10*3/uL (ref 0.9–3.3)

## 2016-01-28 LAB — COMPREHENSIVE METABOLIC PANEL
ALBUMIN: 3.6 g/dL (ref 3.5–5.0)
ALK PHOS: 101 U/L (ref 40–150)
ALT: 9 U/L (ref 0–55)
AST: 12 U/L (ref 5–34)
Anion Gap: 9 mEq/L (ref 3–11)
BILIRUBIN TOTAL: 0.72 mg/dL (ref 0.20–1.20)
BUN: 15.5 mg/dL (ref 7.0–26.0)
CO2: 20 mEq/L — ABNORMAL LOW (ref 22–29)
Calcium: 10.9 mg/dL — ABNORMAL HIGH (ref 8.4–10.4)
Chloride: 116 mEq/L — ABNORMAL HIGH (ref 98–109)
Creatinine: 1.2 mg/dL — ABNORMAL HIGH (ref 0.6–1.1)
EGFR: 56 mL/min/{1.73_m2} — ABNORMAL LOW (ref >90)
GLUCOSE: 85 mg/dL (ref 70–140)
POTASSIUM: 4.2 meq/L (ref 3.5–5.1)
SODIUM: 145 meq/L (ref 136–145)
TOTAL PROTEIN: 7 g/dL (ref 6.4–8.3)

## 2016-01-28 MED ORDER — HEPARIN SOD (PORK) LOCK FLUSH 100 UNIT/ML IV SOLN
500.0000 [IU] | Freq: Once | INTRAVENOUS | Status: AC | PRN
  Administered 2016-01-28: 500 [IU] via INTRAVENOUS
  Filled 2016-01-28: qty 5

## 2016-01-28 MED ORDER — SODIUM CHLORIDE 0.9 % IJ SOLN
10.0000 mL | INTRAMUSCULAR | Status: DC | PRN
  Administered 2016-01-28: 10 mL via INTRAVENOUS
  Filled 2016-01-28: qty 10

## 2016-01-28 NOTE — Progress Notes (Signed)
OFFICE PROGRESS NOTE   January 28, 2016   Physicians: Audree Camel, Sherrine Maples [facility] New patient referral to Dr Erroll Luna 02-03-16   INTERVAL HISTORY:  Patient is seen, together with son, in continuing attention to small cell carcinoma of left breast, treated thus far with 4 cycles of carboplatin VP16, cycle 4 given 7-17 thru 01-13-16. Note first 2 cycles of chemotherapy were by Dr Jacquiline Doe in [place]. She has had only partial response in breast, but no definitive metastatic disease by CT CAP 01-01-16 and  PET 01-25-16.  Consultation now set up with Dr Brantley Stage for 02-03-16; if delay before breast surgery, would do another cycle of chemo first, otherwise likely additional chemo after surgery depending on pathology.  Patient continues to feel well and to tolerate chemotherapy well. She has no discomfort in the left breast and feels that mass is slightly smaller. Appetite is good and energy seems at baseline. She fell outdoors when she slipped on gravel, soreness left ribs since then but otherwise no new or different pain. No SOB or other respiratory symptoms, no significant nausea, no bleeding (bruising from fall), no HA or other neurologic symptoms, no fever or symptoms of infection. No problems with PAC. No swelling LUE. Poor dentition as previously, denies acute symptoms. Remainder of 10 point Review of Systems negative.      PAC replaced by IR on 12-30-15 as previous PAC was not in correct position.   Son very supportive, taking notes during visit.   ONCOLOGIC HISTORY Patient had not had regular medical care in a number of years when she noticed 2 masses in central left breast in Dec 2016. She was seen by Dr [name] in [place], with bilateral mammograms done 09-02-15 at [facility]. The mammograms, with no priors for comparison, showed in the left breast a dense irregular mass at least 3 cm in greatest dimension, with pleomorphic calcifications extending 5 cm inferomedially in  left breast and with associated nipple inversion and retraction; also in the left breast was a 10 mm irregular mass at 9-10 o'clock. In the right breast an irregular mass centrally slightly medial to nipple 1.3 cm diameter. [place] on left breast showed the mass at 12 o'clock measured 2.8 x 2.4 x 2.5 cm with internal vascularity, and additional hypoechoic mass at 9 o'clock 0.8 x 0.4 x 0.6 cm. In right breast by [place] no suspicious solid or cystic mass identified. 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 Morphologically the neuroendocrine carcinoma is reported to show small to intermediate sized cells with high mitotic count and prominent necrosis, strongly positive for pancytokeratin and negative for chromogranin A, positive for synaptophysin, strongly positive for TTF-1, ER 1%, PR 105, Ki-67 of 96%. She subsequently had biopsy of the right breast lesion ([id number]) at [facility], no malignancy.  Staging studies done as follows:  Bone scan [place] 10-05-15 with bilateral anterior lower chest activity not clearly malignant, symmetrical renal activity, osteoarthritis left knee. CT CAP at [facility] 09-30-15 : Chest no hilar or mediastinal adenopathy, thyroid normal, no pericardial effusion, no pulmonary lesions or pleural effusion, no lytic or blastic skeletal lesions. Abdomen numerous hepatic cysts without evidence of hepatic mets, adrenals mildly nodular, numerous renal cysts without hydronephrosis, spleen without focal lesions, pancreas normal, bowels unremarkable other than diverticulosis descending and  sigmoid. No adenopathy. Pelvis no adenopathy, uterine fibroids, endometrium thickened at 23 mm , no ascites, bladder normal . PET Elvina Sidle 9-52-84 no hypermetabolic hilar or Mediastinal  nodes, no pulmonary uptake, left breast mass with SUV 22.89 measuring 3.3 cm without uptake in axillary, retropectoral or supraclavicular nodes. An area of uptake anterior spleen with SUV max 5.63 corresponding to low density structure not present on 09-30-15, no liver concerns, no other uptake abdomen or pelvis. No mention of endometrial lining. Diffuse heterogeneous uptake thru axial and appendicular skeleton which would be consistent either with diffuse marrow/ bone involvement or anemia. Dr Jacquiline Doe discussed with IR, decision made not to attempt biopsy of spleen.  Dr Jacquiline Doe discussed case with breast oncologist Dr Myra Gianotti Muss and Dr L.Curey. Recommendation was for neoadjuvant carbo VP16 x 4 cycles with consideration of additional anthracyclines, then consideration of mastectomy with axillary node evaluation and further management depending on surgical findings.  Note patient refused MRI brain during initial staging.  Patient received cycle 1 carbo VP16 ~ May 1-2-3 in [place], those chemo records not included in outside information. She received neulasta, however developed profound neutropenia without febrile complications, thrombocytopenia and anemia (hgb 7.9) for which she was transfused 2 units PRBCs. Cycle 2 was given 5-22,23,24, reportedly dose reduced from cycle 1. Cycle 2 carbo dose total was 445.5 mg and VP16 118 mg, with decadron 12 mg and Aloxi 0.25 mg. VP16 doses on 5-23 and 5-24 were each 118 mg total , with decadron 12 mg as only antiemetic. Family tells me that she had neulasta also after cycle 2, that information not in these outside records.  Labs 11-16-15 WBC 7.2, ANC 4.8, Hgb 10.2, plt 202k Chemistries date not clear to me (listed as "2 weeks ago") creat 1.01, BUN 15, calcium 9.9, Tprot 6.4, alb 3.65, AP 90, AST 18, Tbili 0.7, ALT 15, Na 145, k 4.3, Cl 111, glu 94  Per Dr Jacquiline Doe, the left breast mass had improved only slightly with chemo given thru cycle 2. Cycle 3 carbo VP16 given  at [facility] 6-19 thru 12-16-15, with neulasta continued.  Due to concerns that the breast mass was not responding as expected, she had restaging CT CAP on 01-01-16 ,which showed breast mass 2.8 x 2.6 cm as compared with 3.3 x 2.6 cm on imaging 09-2015, stable area in spleen. Outside pathology was reviewed by [facility] pathologist, agreed with previous diagnosis.  Cycle 4  Carbo VP16 was given  7-17 thru 01-13-16.Follow up PET after cycle 4, on 01-25-16 showed uptake in left breast mass SUV 17.4 compared with 22.9 previously, no hypermetabolic axillary or mediastinal nodes, stable uptake 5.5 in spleen with calcifications there identified on noncontrast images suggesting benign, diffuse marrow activity consistent with anemia and gCSF, probable gastritis.     Objective:  Vital signs in last 24 hours:  BP (!) 141/87 (BP Location: Left Arm, Patient Position: Sitting)   Pulse 79   Temp 98.3 F (36.8 C) (Oral)   Resp 18   Ht 5' 3.5" (1.613 m)   Wt 189 lb 14.4 oz (86.1 kg)   SpO2 100%   BMI 33.11 kg/m  Weight up 0.5 lb. Alert, oriented and appropriate, very pleasant as always. Ambulatory without difficulty. Looks comfortable, respirations not labored RA. Alopecia  HEENT:PERRL, sclerae not icteric. Oral mucosa moist without lesions, posterior pharynx clear. Poor dentition. Neck supple. No JVD.  Lymphatics:no cervical,supraclavicular, axillary or inguinal adenopathy Resp: clear to auscultation bilaterally and normal percussion bilaterally Cardio: regular rate and  rhythm. No gallop. GI: soft, nontender, not distended, no mass or organomegaly. Normally active bowel sounds.  Musculoskeletal/ Extremities:UE/ LE  without pitting edema, cords, tenderness Neuro: no peripheral neuropathy. Otherwise nonfocal Skin Resolving ecchymoses left lateral ribs and left breast laterally from fall, otherwise without rash, petechiae Left breast smoothly rounded, firm mass just superior to nipple, not fixed, no skin  involvement, possibly slightly smaller/ less prominent than prior to cycle 4, still at least 3 - 3.5 cm diameter. Not tender. Nothing appreciated in left axilla. Portacath- good position without erythema or tenderness  Lab Results:  Results for orders placed or performed in visit on 01/28/16  CBC with Differential  Result Value Ref Range   WBC 8.0 3.9 - 10.3 10e3/uL   NEUT# 6.4 1.5 - 6.5 10e3/uL   HGB 10.7 (L) 11.6 - 15.9 g/dL   HCT 32.3 (L) 34.8 - 46.6 %   Platelets 134 (L) 145 - 400 10e3/uL   MCV 94.7 79.5 - 101.0 fL   MCH 31.5 25.1 - 34.0 pg   MCHC 33.3 31.5 - 36.0 g/dL   RBC 3.41 (L) 3.70 - 5.45 10e6/uL   RDW 17.1 (H) 11.2 - 14.5 %   lymph# 1.1 0.9 - 3.3 10e3/uL   MONO# 0.5 0.1 - 0.9 10e3/uL   Eosinophils Absolute 0.0 0.0 - 0.5 10e3/uL   Basophils Absolute 0.0 0.0 - 0.1 10e3/uL   NEUT% 79.5 (H) 38.4 - 76.8 %   LYMPH% 13.3 (L) 14.0 - 49.7 %   MONO% 6.4 0.0 - 14.0 %   EOS% 0.4 0.0 - 7.0 %   BASO% 0.4 0.0 - 2.0 %  Comprehensive metabolic panel  Result Value Ref Range   Sodium 145 136 - 145 mEq/L   Potassium 4.2 3.5 - 5.1 mEq/L   Chloride 116 (H) 98 - 109 mEq/L   CO2 20 (L) 22 - 29 mEq/L   Glucose 85 70 - 140 mg/dl   BUN 15.5 7.0 - 26.0 mg/dL   Creatinine 1.2 (H) 0.6 - 1.1 mg/dL   Total Bilirubin 0.72 0.20 - 1.20 mg/dL   Alkaline Phosphatase 101 40 - 150 U/L   AST 12 5 - 34 U/L   ALT 9 0 - 55 U/L   Total Protein 7.0 6.4 - 8.3 g/dL   Albumin 3.6 3.5 - 5.0 g/dL   Calcium 10.9 (H) 8.4 - 10.4 mg/dL   Anion Gap 9 3 - 11 mEq/L   EGFR 56 (L) >90 ml/min/1.73 m2     Studies/Results: NM PET Image Restag (PS) Skull Base To Thigh  01-25-16 I  CLINICAL DATA:  Subsequent treatment strategy for breast carcinoma. Small cell carcinoma LEFT breast status post chemotherapy. Neulasta support. Anemia.  COMPARISON:  CT 01/01/2016 FINDINGS: NECK No hypermetabolic lymph nodes in the neck. CHEST Mass lesion in the LEFT breast measures 2.7 cm compared to 2.9 cm on comparison exam.  The metabolic activity remains intense but is decreased from comparison exam with SUV max equal 17.4 decreased from 22.9. No additional hypermetabolic axillary nodes. No hypermetabolic mediastinal nodes. No suspicious pulmonary nodules. ABDOMEN/PELVIS There remains metabolic activity within the anterior aspect of the spleen with SUV max equal 5.5 not changed from prior. There is subtle calcifications on noncontrast CT (image 89, series). There is new metabolic activity through the gastric body (image 100 fused data set). No abnormal metabolic activity liver. Liver cysts are noted. No hypermetabolic abdominal pelvic lymphadenopathy SKELETON There is diffuse increase in marrow activity throughout the axillary appendicular spine. IMPRESSION:  1. Decreased in size and metabolic activity of LEFT breast mass. Breast mass does remain intensely hypermetabolic consists with residual active carcinoma. 2. No change in metabolic activity within the splenic lesion. Calcifications within lesion on noncontrast CT. Favor benign primary lesion of the spleen. 3. Diffuse increased marrow activity consistent with Neulasta support response. This could mask underlying skeletal lesion. No lesion identified on the CT portion exam. 4. New activity within the gastric body favors gastritis.   PACs images reviewed with patient and son at time of visit.  Surgical Path report in this EMR dated 01-01-16 is REVIEW OF OUTSIDE PATH from needle biopsy of left breast mass done at [facility] 09-09-15.    Medications: I have reviewed the patient's current medications. Will add protonix daily for probable gastritis noted on PET.  DISCUSSION Results of PET reviewed with patient and son, with slight improvement but still significant uptake in left breast mass, uptake in bones consistent with anemia and neulasta, new identification of calcifications in the stable isolated splenic lesion thought benign, possible  gastritis, no other apparent distant disease.  I am in favor of definitive breast surgery now if general surgeon agrees; patient requests surgery be done in [place]. Referral made now to [facility], appointment with Dr Brantley Stage 02-03-16. Per that office, surgery for breast malignancies is generally scheduled within 7-10 days of initial consultation. Patient wonders if lumpectomy possible, tho initial recommendation was for mastectomy; I have told her that central disease would require nipple complex be removed if lumpectomy/ partial mastectomy, and radiation afterwards. Axillary node evaluation would be useful with surgery.  At conclusion of this visit, we will wait to hear recommendations from Dr Brantley Stage and timing of surgery, as we could give another cycle of chemo prior if delays, but seems appropriate to proceed with surgery as next step if this can be done in near future.     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 Some limited response to first 2 cycles of carbo VP16 per Dr Jacquiline Doe. Cycle 3 given at [facility] June 19-20-21 with neulasta. Clinically and by CT only partial response of the breast mass. Cycle 4 given 7-17 thru 01-13-16,  PET still suggests limited disease tho also still significant activity in breast. Referral to Dr Brantley Stage, appointment 02-03-16 - thank you. 2. Chemo neutropenia despite neulasta cycle 1, chemo thrombocytopenia and anemia requiring PRBCs also cycle 1. Cycle 2 was dose reduced, neulasta continued. Anemia: iron WNL 11-2015, B12 fine, may be chemo related. 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  5.family history of breast cancer in daughter, pancreatic in father, "bone cancer" in mother and prostate cancer in brother 6.dental and gum  disease: biotene, may need to ask dental medicine to see 7.osteoarthritis left knee 8.has declined information re advance directives 9..hypertension: untreated at presentation to gyn Dr Evie Lacks. Continue tenormin. Patient aware that she should establish with a PCP. 10.gastritis likely by PET. WIll add daily protonix.  11. Fall at home in past week, slipped on gravel. Bruises only, improving.  All questions answered and patient/ son are in agreement with plans above. Further appointments with medical oncology to coordinate with surgery plans. Time spent 30 min including >50% counseling and coordination of care.  Cc Dr Brantley Stage  Evlyn Clines, MD   01/28/2016, 8:46 PM

## 2016-01-31 DIAGNOSIS — K2970 Gastritis, unspecified, without bleeding: Secondary | ICD-10-CM | POA: Insufficient documentation

## 2016-02-01 ENCOUNTER — Telehealth: Payer: Self-pay

## 2016-02-01 DIAGNOSIS — C801 Malignant (primary) neoplasm, unspecified: Secondary | ICD-10-CM

## 2016-02-01 MED ORDER — PANTOPRAZOLE SODIUM 40 MG PO TBEC: 40.0000 mg | DELAYED_RELEASE_TABLET | Freq: Every day | ORAL | 2 refills | Status: DC

## 2016-02-01 NOTE — Telephone Encounter (Signed)
No answer

## 2016-02-01 NOTE — Telephone Encounter (Signed)
-----   Message from [name], MD sent at 01/31/2016  8:46 AM EDT ----- Please let her know that I want to put her on medicine for stomach irritation, as radiologists noticed this on her recent scans, and I see now that she is not on any medicine for that. Protonix 40 mg daily #30 2 RF. Please have her be sure to take this daily for now. Generic fine  thanks

## 2016-02-02 ENCOUNTER — Telehealth: Payer: Self-pay | Admitting: Oncology

## 2016-02-02 ENCOUNTER — Telehealth: Payer: Self-pay | Admitting: *Deleted

## 2016-02-02 NOTE — Telephone Encounter (Signed)
"  I missed appointment at [place].  Thought you all told me 02-03-2016 but [facility] appointment was this morning.  I've been rescheduled for the first available on February 18, 2016.  I am to receive chemotherapy Monday 02-08-2016.  I need to know what the next steps are since I can't see surgeon until the end of August and I know I need to continue receiving chemotherapy.  Return number [phone].  Alternate number is my husbands cell [phone]."

## 2016-02-02 NOTE — Telephone Encounter (Signed)
Medical Oncology  Patient called [facility] triage in past hour, as she missed new patient appointment with Dr Brantley Stage due to confusion about date. I spoke with schedulers at [facility], then called back by Dr Cornett's nurse to let us know that patient can be worked in this afternoon.  [facility] to contact patient now, aware that she lives in [place], [place].  I apologized for confusion and thanked that office for their assistance.  L.Livesay MD

## 2016-02-02 NOTE — Telephone Encounter (Signed)
Called patient after reading provider response.  She has not heard from [facility].  Advised she call [facility] as Dr. Marko Plume has advised she be seen sooner than what originally available.

## 2016-02-03 ENCOUNTER — Telehealth: Payer: Self-pay

## 2016-02-03 NOTE — Telephone Encounter (Signed)
-----   Message from [name], MD sent at 02/02/2016 11:37 AM EDT ----- Dr [name] office is calling patient now, as they can work her in this afternoon (if she is able to come from [place] for that time)   Maria Strickland

## 2016-02-03 NOTE — Telephone Encounter (Signed)
S/w pt about the protonix and what it is for. She will pick it up from pharmacy.

## 2016-02-03 NOTE — Telephone Encounter (Signed)
Maria Strickland was not able to make it to [facility] yesterday 02-02-16 to see Dr. Brantley Stage. She was r/s to see Dr. Brantley Stage on 02-18-16 at 1350.

## 2016-02-04 ENCOUNTER — Telehealth: Payer: Self-pay

## 2016-02-04 ENCOUNTER — Telehealth: Payer: Self-pay | Admitting: *Deleted

## 2016-02-04 ENCOUNTER — Telehealth: Payer: Self-pay | Admitting: Oncology

## 2016-02-04 NOTE — Telephone Encounter (Signed)
-----   Message from [name], MD sent at 02/04/2016  8:29 AM EDT ----- Patient missed or was unable to keep apts this week with surgeon, apparently now to see Dr Brantley Stage for consultation on 8-24.  Last chemo 7-17 thru 7-19  Infusion space available to treat next week 8-14,15  Please let patient know I think best to go ahead with another chemo rather than waiting. Will do only 2 days of chemo and will need to be sure counts ok prior to surgery, which may be ~ a week after Dr Brantley Stage sees her. If ok with patient as above, tell Sharyn Lull that we do NOT need day 3 on 8-16. Please send me message so I can be sure to put in "POF" and orders for lab + carbo VP16 on 8-14 and VP16 on 8-15 with on pro neulasta  After surgeon sees her, we will need to know planned surgery date to coordinate preop labs and next visit here.  thanks

## 2016-02-04 NOTE — Telephone Encounter (Signed)
S/w pt per Dr Marko Plume attached message. Pt was hoping we would do this. She prefers early morning appts.

## 2016-02-04 NOTE — Telephone Encounter (Signed)
Spoke with pt to confirm 8/14 and 8/15 appt date/times per [facility]

## 2016-02-04 NOTE — Telephone Encounter (Signed)
Per Dr. Marko Plume I have scheduled appts

## 2016-02-05 ENCOUNTER — Other Ambulatory Visit: Payer: Self-pay | Admitting: Oncology

## 2016-02-08 ENCOUNTER — Ambulatory Visit (HOSPITAL_BASED_OUTPATIENT_CLINIC_OR_DEPARTMENT_OTHER): Payer: Medicare PPO

## 2016-02-08 ENCOUNTER — Other Ambulatory Visit (HOSPITAL_BASED_OUTPATIENT_CLINIC_OR_DEPARTMENT_OTHER): Payer: Medicare PPO

## 2016-02-08 VITALS — BP 165/95 | HR 64 | Temp 98.7°F | Resp 18

## 2016-02-08 DIAGNOSIS — C50812 Malignant neoplasm of overlapping sites of left female breast: Secondary | ICD-10-CM | POA: Diagnosis not present

## 2016-02-08 DIAGNOSIS — C801 Malignant (primary) neoplasm, unspecified: Secondary | ICD-10-CM

## 2016-02-08 DIAGNOSIS — Z5111 Encounter for antineoplastic chemotherapy: Secondary | ICD-10-CM | POA: Diagnosis not present

## 2016-02-08 LAB — COMPREHENSIVE METABOLIC PANEL
AST: 9 U/L (ref 5–34)
Albumin: 3.4 g/dL — ABNORMAL LOW (ref 3.5–5.0)
Alkaline Phosphatase: 87 U/L (ref 40–150)
Anion Gap: 5 mEq/L (ref 3–11)
BILIRUBIN TOTAL: 0.96 mg/dL (ref 0.20–1.20)
BUN: 16.1 mg/dL (ref 7.0–26.0)
CALCIUM: 10.6 mg/dL — AB (ref 8.4–10.4)
CHLORIDE: 116 meq/L — AB (ref 98–109)
CO2: 24 meq/L (ref 22–29)
CREATININE: 1.1 mg/dL (ref 0.6–1.1)
EGFR: 61 mL/min/{1.73_m2} — ABNORMAL LOW (ref >90)
Glucose: 91 mg/dl (ref 70–140)
Potassium: 4.2 mEq/L (ref 3.5–5.1)
Sodium: 145 mEq/L (ref 136–145)
TOTAL PROTEIN: 6.8 g/dL (ref 6.4–8.3)

## 2016-02-08 LAB — CBC WITH DIFFERENTIAL/PLATELET
BASO%: 0.7 % (ref 0.0–2.0)
Basophils Absolute: 0 10*3/uL (ref 0.0–0.1)
EOS%: 0.3 % (ref 0.0–7.0)
Eosinophils Absolute: 0 10*3/uL (ref 0.0–0.5)
HEMATOCRIT: 32.7 % — AB (ref 34.8–46.6)
HEMOGLOBIN: 10.9 g/dL — AB (ref 11.6–15.9)
LYMPH#: 0.9 10*3/uL (ref 0.9–3.3)
LYMPH%: 15.2 % (ref 14.0–49.7)
MCH: 31.5 pg (ref 25.1–34.0)
MCHC: 33.3 g/dL (ref 31.5–36.0)
MCV: 94.6 fL (ref 79.5–101.0)
MONO#: 0.4 10*3/uL (ref 0.1–0.9)
MONO%: 7.4 % (ref 0.0–14.0)
NEUT%: 76.4 % (ref 38.4–76.8)
NEUTROS ABS: 4.3 10*3/uL (ref 1.5–6.5)
Platelets: 151 10*3/uL (ref 145–400)
RBC: 3.46 10*6/uL — ABNORMAL LOW (ref 3.70–5.45)
RDW: 16.5 % — AB (ref 11.2–14.5)
WBC: 5.7 10*3/uL (ref 3.9–10.3)

## 2016-02-08 MED ORDER — SODIUM CHLORIDE 0.9 % IV SOLN
Freq: Once | INTRAVENOUS | Status: AC
  Administered 2016-02-08: 15:00:00 via INTRAVENOUS

## 2016-02-08 MED ORDER — PALONOSETRON HCL INJECTION 0.25 MG/5ML
INTRAVENOUS | Status: AC
  Filled 2016-02-08: qty 5

## 2016-02-08 MED ORDER — ETOPOSIDE CHEMO INJECTION 1 GM/50ML
60.0000 mg/m2 | Freq: Once | INTRAVENOUS | Status: AC
  Administered 2016-02-08: 120 mg via INTRAVENOUS
  Filled 2016-02-08: qty 6

## 2016-02-08 MED ORDER — PALONOSETRON HCL INJECTION 0.25 MG/5ML
0.2500 mg | Freq: Once | INTRAVENOUS | Status: AC
  Administered 2016-02-08: 0.25 mg via INTRAVENOUS

## 2016-02-08 MED ORDER — SODIUM CHLORIDE 0.9 % IV SOLN
364.8000 mg | Freq: Once | INTRAVENOUS | Status: AC
  Administered 2016-02-08: 360 mg via INTRAVENOUS
  Filled 2016-02-08: qty 36

## 2016-02-08 MED ORDER — SODIUM CHLORIDE 0.9 % IV SOLN
10.0000 mg | Freq: Once | INTRAVENOUS | Status: AC
  Administered 2016-02-08: 10 mg via INTRAVENOUS
  Filled 2016-02-08: qty 1

## 2016-02-08 NOTE — Progress Notes (Signed)
Pt vss

## 2016-02-08 NOTE — Progress Notes (Deleted)
err

## 2016-02-08 NOTE — Progress Notes (Signed)
1430- Pt R pac was not accessed for chemo today. Pt reported that she fell 2 weeks ago on her L side, but had noticed something potruding from under the port. Felt pt port to check for needle placement, and felt a wire potruding above the pac. Notified Dr. Marko Plume, per Barbaraann Share, RN about possibly getting a chest xray to confirm that her port is still intact. Pt agreeable to have lab work drawn peripherally and chemo through a PIV as well. Successful insertion of 24g RFA iv performed w/o any difficulty. Will continue to follow up for pt xray or IR appt.   1600- Pt seen in infusion by IR, and assessed pt port. Pt does not require any chest xray to verify pac placement. Pt has a disolvable suture underneath the incision. Okay to use pac for port access.  Patient discharged in stable condition

## 2016-02-09 ENCOUNTER — Ambulatory Visit (HOSPITAL_BASED_OUTPATIENT_CLINIC_OR_DEPARTMENT_OTHER): Payer: Medicare PPO

## 2016-02-09 VITALS — BP 146/83 | HR 71 | Temp 98.3°F | Resp 18

## 2016-02-09 DIAGNOSIS — C50812 Malignant neoplasm of overlapping sites of left female breast: Secondary | ICD-10-CM

## 2016-02-09 DIAGNOSIS — Z5111 Encounter for antineoplastic chemotherapy: Secondary | ICD-10-CM

## 2016-02-09 DIAGNOSIS — D701 Agranulocytosis secondary to cancer chemotherapy: Secondary | ICD-10-CM | POA: Diagnosis not present

## 2016-02-09 DIAGNOSIS — C801 Malignant (primary) neoplasm, unspecified: Secondary | ICD-10-CM

## 2016-02-09 MED ORDER — PEGFILGRASTIM 6 MG/0.6ML ~~LOC~~ PSKT
6.0000 mg | PREFILLED_SYRINGE | Freq: Once | SUBCUTANEOUS | Status: AC
  Administered 2016-02-09: 6 mg via SUBCUTANEOUS
  Filled 2016-02-09: qty 0.6

## 2016-02-09 MED ORDER — HEPARIN SOD (PORK) LOCK FLUSH 100 UNIT/ML IV SOLN
500.0000 [IU] | Freq: Once | INTRAVENOUS | Status: AC | PRN
  Administered 2016-02-09: 500 [IU]
  Filled 2016-02-09: qty 5

## 2016-02-09 MED ORDER — SODIUM CHLORIDE 0.9 % IV SOLN
10.0000 mg | Freq: Once | INTRAVENOUS | Status: AC
  Administered 2016-02-09: 10 mg via INTRAVENOUS
  Filled 2016-02-09: qty 1

## 2016-02-09 MED ORDER — SODIUM CHLORIDE 0.9% FLUSH
10.0000 mL | INTRAVENOUS | Status: DC | PRN
  Administered 2016-02-09: 10 mL
  Filled 2016-02-09: qty 10

## 2016-02-09 MED ORDER — SODIUM CHLORIDE 0.9 % IV SOLN
Freq: Once | INTRAVENOUS | Status: AC
  Administered 2016-02-09: 14:00:00 via INTRAVENOUS

## 2016-02-09 MED ORDER — SODIUM CHLORIDE 0.9 % IV SOLN
60.0000 mg/m2 | Freq: Once | INTRAVENOUS | Status: AC
  Administered 2016-02-09: 120 mg via INTRAVENOUS
  Filled 2016-02-09: qty 6

## 2016-02-09 NOTE — Patient Instructions (Signed)
[facility] Discharge Instructions for Patients Receiving Chemotherapy  Today you received the following chemotherapy agents: Etoposide   To help prevent nausea and vomiting after your treatment, we encourage you to take your nausea medication as directed.    If you develop nausea and vomiting that is not controlled by your nausea medication, call the clinic.   BELOW ARE SYMPTOMS THAT SHOULD BE REPORTED IMMEDIATELY:  *FEVER GREATER THAN 100.5 F  *CHILLS WITH OR WITHOUT FEVER  NAUSEA AND VOMITING THAT IS NOT CONTROLLED WITH YOUR NAUSEA MEDICATION  *UNUSUAL SHORTNESS OF BREATH  *UNUSUAL BRUISING OR BLEEDING  TENDERNESS IN MOUTH AND THROAT WITH OR WITHOUT PRESENCE OF ULCERS  *URINARY PROBLEMS  *BOWEL PROBLEMS  UNUSUAL RASH Items with * indicate a potential emergency and should be followed up as soon as possible.  Feel free to call the clinic you have any questions or concerns. The clinic phone number is [phone].  Please show the CHEMO ALERT CARD at check-in to the Emergency Department and triage nurse.   

## 2016-02-10 ENCOUNTER — Ambulatory Visit: Payer: Medicare PPO

## 2016-02-18 ENCOUNTER — Ambulatory Visit: Payer: Self-pay | Admitting: Surgery

## 2016-02-18 DIAGNOSIS — C50012 Malignant neoplasm of nipple and areola, left female breast: Secondary | ICD-10-CM

## 2016-02-18 NOTE — H&P (Signed)
Codie Wernecke 02/18/2016 2:04 PM Location: [facility] Patient #: [id number] DOB: Jan 19, 1946 Married / Language: English / Race: Black or African American Female  History of Present Illness Marcello Moores A. Enos Muhl MD; 02/18/2016 5:03 PM) Patient words: [name] Patient was diagnosed and treated in in March 2017. She had a large centrally located mass of her left breast that caused nipple retraction. 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 She has been on neoadjuvant chemotherapy with modest reduction in size of her left breast cancer. She is sent at the request of medical oncology for this and discussion of surgical management. She states she thinks the area is slightly smaller but not much. She is received multiple rounds of chemotherapy via a Port-A-Cath. 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  The patient is a 70 year old female.   Allergies Elbert Ewings, [place]; 02/18/2016 2:07 PM) No Known Drug Allergies 02/18/2016  Medication History Elbert Ewings, [place]; 02/18/2016 2:08 PM) Pantoprazole Sodium (40MG  Tablet DR, Oral) Active. Tylenol (500MG  Capsule, Oral) Active. Coenzyme Q10 (100MG  Capsule, Oral) Active. Vitamin D (Cholecalciferol) (1000UNIT Tablet, Oral) Active. Atenolol (50MG  Tablet, Oral) Active. LORazepam (1MG  Tablet, Oral) Active. Prochlorperazine Maleate (10MG  Tablet, Oral) Active. Lidocaine-Prilocaine (2.5-2.5% Cream, External) Active. Medications Reconciled     Review of Systems (Lajoy Vanamburg A. Salina Stanfield MD; 02/18/2016 5:06 PM) General Not Present- Appetite Loss, Chills, Fatigue, Fever, Night Sweats, Weight Gain and Weight Loss. Skin Not Present- Change in Wart/Mole, Dryness, Hives, Jaundice, New Lesions, Non-Healing Wounds, Rash and Ulcer. HEENT Not Present- Earache, Hearing Loss, Hoarseness, Nose Bleed, Oral Ulcers, Ringing in the Ears, Seasonal Allergies, Sinus Pain,  Sore Throat, Visual Disturbances, Wears glasses/contact lenses and Yellow Eyes. Respiratory Not Present- Bloody sputum, Chronic Cough, Difficulty Breathing, Snoring and Wheezing. Breast Note: left breast mass inverted nipple  Cardiovascular Not Present- Chest Pain, Difficulty Breathing Lying Down, Leg Cramps, Palpitations, Rapid Heart Rate, Shortness of Breath and Swelling of Extremities. Female Genitourinary Not Present- Frequency, Nocturia, Painful Urination, Pelvic Pain and Urgency. Musculoskeletal Not Present- Back Pain, Joint Pain, Joint Stiffness, Muscle Pain, Muscle Weakness and Swelling of Extremities. Neurological Not Present- Decreased Memory, Fainting, Headaches, Numbness, Seizures, Tingling, Tremor, Trouble walking and Weakness. Psychiatric Not Present- Anxiety, Bipolar, Change in Sleep Pattern, Depression, Fearful and Frequent crying. Endocrine Not Present- Cold Intolerance, Excessive Hunger, Hair Changes, Heat Intolerance, Hot flashes and New Diabetes. Hematology Not Present- Easy Bruising, Excessive bleeding, Gland problems, HIV and Persistent Infections.  Vitals Elbert Ewings CMA; 02/18/2016 2:08 PM) 02/18/2016 2:08 PM Weight: 187 lb Height: 64in Body Surface Area: 1.9 m Body Mass Index: 32.1 kg/m  Temp.: 97.49F(Temporal)  Pulse: 80 (Regular)  BP: 142/86 (Sitting, Left Arm, Standard)      Physical Exam (Minnah Llamas A. Chozen Latulippe MD; 02/18/2016 5:04 PM)  General Mental Status-Alert. General Appearance-Consistent with stated age. Hydration-Well hydrated. Voice-Normal.  Head and Neck Head-normocephalic, atraumatic with no lesions or palpable masses. Trachea-midline. Thyroid Gland Characteristics - normal size and consistency.  Eye Eyeball - Bilateral-Extraocular movements intact. Sclera/Conjunctiva - Bilateral-No scleral icterus.  Chest and Lung Exam Chest and lung exam reveals -quiet, even and easy respiratory effort with no use of  accessory muscles and on auscultation, normal breath sounds, no adventitious sounds and normal vocal resonance. Inspection Chest Wall - Normal. Back - normal.  Breast Note: Large central located left breast cancer with inversion of left nipple. Gross measurements about 4 cm x 4 cm. No left axillary adenopathy. Right  breast is normal.  Neurologic Neurologic evaluation reveals -alert and oriented x 3 with no impairment of recent or remote memory. Mental Status-Normal.  Musculoskeletal Normal Exam - Left-Upper Extremity Strength Normal and Lower Extremity Strength Normal. Normal Exam - Right-Upper Extremity Strength Normal and Lower Extremity Strength Normal.  Lymphatic Head & Neck  General Head & Neck Lymphatics: Bilateral - Description - Normal. Axillary  General Axillary Region: Bilateral - Description - Normal. Tenderness - Non Tender.    Assessment & Plan (Ansleigh Safer A. Bereket Gernert MD; 02/18/2016 5:05 PM)  BREAST CANCER, LEFT (C50.912) Impression: Patient is responded minimally to neoadjuvant chemotherapy. Recommend a left breast mastectomy. Recommend left breast sentinel lymph node mapping. She is not a good lumpectomy candidate due to large size of tumor, central location in significant cosmetic deformity from breast conservation. Discussed reconstruction. Patient would like to wait on that. Discussed treatment options for breast cancer to include breast conservation vs mastectomy with reconstruction. Pt has decided on mastectomy. Risk include bleeding, infection, flap necrosis, pain, numbness, recurrence, hematoma, other surgery needs. Pt understands and agrees to proceed. Risk of sentinel lymph node mapping include bleeding, infection, lymphedema, shoulder pain. stiffness, dye allergy. cosmetic deformity , blood clots, death, need for more surgery. Pt agres to proceed.  Current Plans You are being scheduled for surgery - Our schedulers will call you.  You should hear from our  office's scheduling department within 5 working days about the location, date, and time of surgery. We try to make accommodations for patient's preferences in scheduling surgery, but sometimes the OR schedule or the surgeon's schedule prevents [place] from making those accommodations.  If you have not heard from our office [phone]) in 5 working days, call the office and ask for your surgeon's nurse.  If you have other questions about your diagnosis, plan, or surgery, call the office and ask for your surgeon's nurse.  Pt Education - Pamphlet Given - Breast Biopsy: discussed with patient and provided information. Pt Education - CCS Mastectomy HCI

## 2016-02-20 ENCOUNTER — Other Ambulatory Visit: Payer: Self-pay | Admitting: Oncology

## 2016-02-22 ENCOUNTER — Ambulatory Visit (HOSPITAL_BASED_OUTPATIENT_CLINIC_OR_DEPARTMENT_OTHER): Payer: Medicare PPO | Admitting: Oncology

## 2016-02-22 ENCOUNTER — Ambulatory Visit (HOSPITAL_BASED_OUTPATIENT_CLINIC_OR_DEPARTMENT_OTHER): Payer: Medicare PPO

## 2016-02-22 ENCOUNTER — Telehealth: Payer: Self-pay | Admitting: Oncology

## 2016-02-22 ENCOUNTER — Other Ambulatory Visit (HOSPITAL_BASED_OUTPATIENT_CLINIC_OR_DEPARTMENT_OTHER): Payer: Medicare PPO

## 2016-02-22 ENCOUNTER — Encounter: Payer: Self-pay | Admitting: Oncology

## 2016-02-22 VITALS — BP 170/95 | HR 74 | Temp 99.5°F | Resp 18 | Ht 63.5 in | Wt 189.7 lb

## 2016-02-22 DIAGNOSIS — Z452 Encounter for adjustment and management of vascular access device: Secondary | ICD-10-CM | POA: Diagnosis not present

## 2016-02-22 DIAGNOSIS — C801 Malignant (primary) neoplasm, unspecified: Secondary | ICD-10-CM

## 2016-02-22 DIAGNOSIS — C50812 Malignant neoplasm of overlapping sites of left female breast: Secondary | ICD-10-CM

## 2016-02-22 DIAGNOSIS — Z95828 Presence of other vascular implants and grafts: Secondary | ICD-10-CM

## 2016-02-22 DIAGNOSIS — D701 Agranulocytosis secondary to cancer chemotherapy: Secondary | ICD-10-CM

## 2016-02-22 DIAGNOSIS — D6959 Other secondary thrombocytopenia: Secondary | ICD-10-CM

## 2016-02-22 DIAGNOSIS — T451X5A Adverse effect of antineoplastic and immunosuppressive drugs, initial encounter: Secondary | ICD-10-CM

## 2016-02-22 DIAGNOSIS — C50912 Malignant neoplasm of unspecified site of left female breast: Secondary | ICD-10-CM | POA: Diagnosis not present

## 2016-02-22 DIAGNOSIS — J22 Unspecified acute lower respiratory infection: Secondary | ICD-10-CM

## 2016-02-22 LAB — COMPREHENSIVE METABOLIC PANEL
ALT: <9 U/L (ref 0–55)
ANION GAP: 7 meq/L (ref 3–11)
AST: 10 U/L (ref 5–34)
Albumin: 3.2 g/dL — ABNORMAL LOW (ref 3.5–5.0)
Alkaline Phosphatase: 77 U/L (ref 40–150)
BUN: 13.9 mg/dL (ref 7.0–26.0)
CHLORIDE: 116 meq/L — AB (ref 98–109)
CO2: 23 meq/L (ref 22–29)
Calcium: 10.4 mg/dL (ref 8.4–10.4)
Creatinine: 1 mg/dL (ref 0.6–1.1)
EGFR: 67 mL/min/{1.73_m2} — AB (ref >90)
GLUCOSE: 93 mg/dL (ref 70–140)
POTASSIUM: 4.2 meq/L (ref 3.5–5.1)
SODIUM: 146 meq/L — AB (ref 136–145)
Total Bilirubin: 0.9 mg/dL (ref 0.20–1.20)
Total Protein: 6.6 g/dL (ref 6.4–8.3)

## 2016-02-22 LAB — CBC WITH DIFFERENTIAL/PLATELET
BASO%: 0.9 % (ref 0.0–2.0)
Basophils Absolute: 0 10*3/uL (ref 0.0–0.1)
EOS ABS: 0.1 10*3/uL (ref 0.0–0.5)
EOS%: 2.4 % (ref 0.0–7.0)
HCT: 29 % — ABNORMAL LOW (ref 34.8–46.6)
HGB: 10 g/dL — ABNORMAL LOW (ref 11.6–15.9)
LYMPH%: 23.1 % (ref 14.0–49.7)
MCH: 31.6 pg (ref 25.1–34.0)
MCHC: 34.5 g/dL (ref 31.5–36.0)
MCV: 91.8 fL (ref 79.5–101.0)
MONO#: 0.2 10*3/uL (ref 0.1–0.9)
MONO%: 5.7 % (ref 0.0–14.0)
NEUT%: 67.9 % (ref 38.4–76.8)
NEUTROS ABS: 2.3 10*3/uL (ref 1.5–6.5)
PLATELETS: 60 10*3/uL — AB (ref 145–400)
RBC: 3.16 10*6/uL — AB (ref 3.70–5.45)
RDW: 14.8 % — ABNORMAL HIGH (ref 11.2–14.5)
WBC: 3.3 10*3/uL — AB (ref 3.9–10.3)
lymph#: 0.8 10*3/uL — ABNORMAL LOW (ref 0.9–3.3)

## 2016-02-22 MED ORDER — HEPARIN SOD (PORK) LOCK FLUSH 100 UNIT/ML IV SOLN
500.0000 [IU] | Freq: Once | INTRAVENOUS | Status: AC | PRN
  Administered 2016-02-22: 500 [IU] via INTRAVENOUS
  Filled 2016-02-22: qty 5

## 2016-02-22 MED ORDER — TEMAZEPAM 15 MG PO CAPS: 15.0000 mg | ORAL_CAPSULE | Freq: Every evening | ORAL | 0 refills | Status: AC | PRN

## 2016-02-22 MED ORDER — SODIUM CHLORIDE 0.9 % IJ SOLN
10.0000 mL | INTRAMUSCULAR | Status: DC | PRN
  Administered 2016-02-22: 10 mL via INTRAVENOUS
  Filled 2016-02-22: qty 10

## 2016-02-22 MED ORDER — AZITHROMYCIN 250 MG PO TABS: ORAL_TABLET | ORAL | 0 refills | Status: DC

## 2016-02-22 NOTE — Telephone Encounter (Signed)
appt made and avs printed °

## 2016-02-22 NOTE — Progress Notes (Signed)
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 Plan is for left mastectomy with sentinel axillary node evaluation by Dr Brantley Stage upcoming, without reconstruction.  First 2 cycles of chemo were in [place] by Dr Jacquiline Doe, which is not reflected in cycle numbers in this EMR.   Patient was not able to keep earlier scheduled appointments with Dr Brantley Stage, but saw him in consultation on 02-18-16. Per his office now, anticipating surgery ~ Sept 12-14. Patient understands that she will be hospitalized at least 1 night for the surgery.  Patient tolerated most recent chemotherapy well, as she has thru all of this. She has had upper respiratory congestion and drainage and some productive cough x ~ 3 days, husband recently with same symptoms, T max 99, no chills, not SOB and no chest pain. Symptoms are not improving yet.  Otherwise, she has had no significant nausea and no vomiting. Bowels are moving. She feels that there has been further decrease in size of left breast mass. She denies new or different pain, swelling RUE or other extremities, no problems with PAC. She has had no bleeding or unusual bleeding (note platelets 60K today).  She is not sleeping well even with ativan, which had initially been helpful, mostly anxious re upcoming medical interventions. Remainder of 10 point Review of Systems negative   PAC replaced by IR on 12-30-15   ONCOLOGIC HISTORY Patient had not had regular medical care in a number of years when she noticed 2 masses in central left breast in Dec 2016. She was seen by Dr [name] in [place], with bilateral mammograms done 09-02-15 at [facility]. The  mammograms, with no priors for comparison, showed in the left breast a dense irregular mass at least 3 cm in greatest dimension, with pleomorphic calcifications extending 5 cm inferomedially in left breast and with associated nipple inversion and retraction; also in the left breast was a 10 mm irregular mass at 9-10 o'clock. In the right breast an irregular mass centrally slightly medial to nipple 1.3 cm diameter. [place] on left breast showed the mass at 12 o'clock measured 2.8 x 2.4 x 2.5 cm with internal vascularity, and additional hypoechoic mass at 9 o'clock 0.8 x 0.4 x 0.6 cm. In right breast by [place] no suspicious solid or cystic mass identified. 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 Morphologically the neuroendocrine carcinoma is reported to show small to intermediate sized cells with high mitotic count and prominent necrosis, strongly positive for pancytokeratin and negative for chromogranin A, positive for synaptophysin, strongly positive for TTF-1, ER 1%, PR 105, Ki-67 of 96%. She subsequently had biopsy of the right breast lesion ([id number]) at [facility], no malignancy.  Staging studies done as follows:  Bone scan [place] 10-05-15 with bilateral anterior lower chest activity not clearly malignant, symmetrical renal activity, osteoarthritis left knee. CT CAP at [facility] 09-30-15 : Chest no hilar or mediastinal adenopathy, thyroid normal, no pericardial effusion, no pulmonary lesions or pleural effusion, no lytic or  blastic skeletal lesions. Abdomen numerous hepatic cysts without evidence of hepatic mets, adrenals mildly nodular, numerous renal cysts without hydronephrosis, spleen without focal lesions, pancreas normal, bowels unremarkable other than diverticulosis descending  and sigmoid. No adenopathy. Pelvis no adenopathy, uterine fibroids, endometrium thickened at 23 mm , no ascites, bladder normal . PET Elvina Sidle 10-13-60 no hypermetabolic hilar or Mediastinal nodes, no pulmonary uptake, left breast mass with SUV 22.89 measuring 3.3 cm without uptake in axillary, retropectoral or supraclavicular nodes. An area of uptake anterior spleen with SUV max 5.63 corresponding to low density structure not present on 09-30-15, no liver concerns, no other uptake abdomen or pelvis. No mention of endometrial lining. Diffuse heterogeneous uptake thru axial and appendicular skeleton which would be consistent either with diffuse marrow/ bone involvement or anemia. Dr Jacquiline Doe discussed with IR, decision made not to attempt biopsy of spleen.  Dr Jacquiline Doe discussed case with breast oncologist Dr Myra Gianotti Muss and Dr L.Curey. Recommendation was for neoadjuvant carbo VP16 x 4 cycles with consideration of additional anthracyclines, then consideration of mastectomy with axillary node evaluation and further management depending on surgical findings.  Note patient refused MRI brain during initial staging.  Patient received cycle 1 carbo VP16 ~ May 1-2-3 in [place], those chemo records not included in outside information. She received neulasta, however developed profound neutropenia without febrile complications, thrombocytopenia and anemia (hgb 7.9) for which she was transfused 2 units PRBCs. Cycle 2 was given 5-22,23,24, reportedly dose reduced from cycle 1. Cycle 2 carbo dose total was 445.5 mg and VP16 118 mg, with decadron 12 mg and Aloxi 0.25 mg. VP16 doses on 5-23 and 5-24 were each 118 mg total , with decadron 12 mg as only antiemetic. Family tells me that she had neulasta also after cycle 2, that information not in these outside records.  Labs 11-16-15 WBC 7.2, ANC 4.8, Hgb 10.2, plt 202k Chemistries date not clear to me (listed as "2 weeks ago") creat 1.01, BUN 15, calcium 9.9, Tprot 6.4, alb  3.65, AP 90, AST 18, Tbili 0.7, ALT 15, Na 145, k 4.3, Cl 111, glu 94  Per Dr Jacquiline Doe, the left breast mass had improved only slightly with chemo given thru cycle 2. Cycle 3 carbo VP16 given at [facility] 6-19 thru 12-16-15, with neulasta continued.  Due to concerns that the breast mass was not responding as expected, she had restaging CT CAP on 01-01-16 ,which showed breast mass 2.8 x 2.6 cm as compared with 3.3 x 2.6 cm on imaging 09-2015, stable area in spleen. Outside pathology was reviewed by [facility] pathologist, agreed with previous diagnosis.  Cycle 4  Carbo VP16 was given  7-17 thru 01-13-16.Follow up PET after cycle 4, on 01-25-16 showed uptake in left breast mass SUV 17.4 compared with 22.9 previously, no hypermetabolic axillary or mediastinal nodes, stable uptake 5.5 in spleen with calcifications there identified on noncontrast images suggesting benign, diffuse marrow activity consistent with anemia and gCSF, probable gastritis. Cycle 5 was given days 1 and 2 only due to concern for cytopenias and timing of upcoming surgery.    Objective:  Vital signs in last 24 hours:  BP (!) 170/95 (BP Location: Left Arm, Patient Position: Sitting) Comment: nurse louise RN is aware of bp 170/95  Pulse 74   Temp 99.5 F (37.5 C) (Oral)   Resp 18   Ht 5' 3.5" (1.613 m)   Wt 189 lb 11.2 oz (86 kg)   SpO2 100%   BMI 33.08 kg/m  Weight stable.  Sounds congested, occasional cough, does not appear SOB or acutely ill Alert, oriented and appropriate. Ambulatory without  difficulty.  Alopecia  HEENT:PERRL, sclerae not icteric. Oral mucosa moist without lesions, posterior pharynx minimal erythema without exudate. Nares boggy without purulent drainage  Neck supple. No JVD.  Lymphatics:no cervical,supraclavicular, axillary adenopathy Resp: minimal scattered crackles lower fields bilaterally, no wheezing, no use of accessory muscles and normal percussion bilaterally Cardio: regular rate and rhythm. No  gallop. GI: soft, nontender, not distended, no mass or organomegaly. Normally active bowel sounds. Musculoskeletal/ Extremities: UE/ LE without pitting edema, cords, tenderness Neuro: no peripheral neuropathy. Otherwise nonfocal Skin without rash, ecchymosis, petechiae Breasts:left with easily palpable, smooth firm mass at and above areola from ~ 1200 - 2:00, not as vertically prominent as when I first examined her but diameter not significantly changed. Right breast  without dominant mass, skin or nipple findings. Axillae benign. Portacath-without erythema or tenderness  Lab Results:  Results for orders placed or performed in visit on 02/22/16  CBC with Differential  Result Value Ref Range   WBC 3.3 (L) 3.9 - 10.3 10e3/uL   NEUT# 2.3 1.5 - 6.5 10e3/uL   HGB 10.0 (L) 11.6 - 15.9 g/dL   HCT 29.0 (L) 34.8 - 46.6 %   Platelets 60 (L) 145 - 400 10e3/uL   MCV 91.8 79.5 - 101.0 fL   MCH 31.6 25.1 - 34.0 pg   MCHC 34.5 31.5 - 36.0 g/dL   RBC 3.16 (L) 3.70 - 5.45 10e6/uL   RDW 14.8 (H) 11.2 - 14.5 %   lymph# 0.8 (L) 0.9 - 3.3 10e3/uL   MONO# 0.2 0.1 - 0.9 10e3/uL   Eosinophils Absolute 0.1 0.0 - 0.5 10e3/uL   Basophils Absolute 0.0 0.0 - 0.1 10e3/uL   NEUT% 67.9 38.4 - 76.8 %   LYMPH% 23.1 14.0 - 49.7 %   MONO% 5.7 0.0 - 14.0 %   EOS% 2.4 0.0 - 7.0 %   BASO% 0.9 0.0 - 2.0 %   CBC discussed at visit  Studies/Results:  No results found.  Medications: I have reviewed the patient's current medications. Will try Restoril 15 mg at hs; she will let us know if not helpful enough, in which case could increase to 30 mg at hs. She knows not to take ativan with this.  Z pack for respiratory infection with productive cough.   DISCUSSION Interval history as above. She is in agreement with plans for surgery. She understands that she will probably need additional chemotherapy, which we will decide in part based on pathology findings from the mastectomy.  Medications as above.  With  immunocompromised state from chemo, upcoming surgery and productive cough, will give z pack for the respiratory infection.  She knows to call if bleeding or unusual bruising due to platelets still low from most recent (abbreviated) chemotherapy. She will have preop labs to include repeat CBC closer to surgery.     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 Some limited response to first 2 cycles of carbo VP16 per Dr Jacquiline Doe. Cycle 3 given at [facility] 19-20-21 with neulasta. Clinically and by CT only partial response of the breast mass. PET after cycle 4 still suggests limited disease, tho also still significant activity in breast. Mastectomy with sentinel node evaluation planned by Dr Brantley Stage for ~ Sept 12 such that cycle 5 given days 1 and 2 only on 8-14/ 8-15. Expect  additional chemo after surgery based on path.  2. Chemo neutropenia despite neulasta cycle 1, chemo thrombocytopenia and anemia requiring PRBCs also cycle 1. Cycle 2 was dose reduced, neulasta continued.  Anemia: iron WNL 11-2015, B12 fine, may be chemo related.  3.chemo thrombocytopenia: platelets 60k now, no bleeding, expect platelets should recover in next ~ 2 weeks in time for surgery. WIll have repear CBC preop. 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  5.family history of breast cancer in daughter, pancreatic in father, "bone cancer" in mother and prostate cancer in brother 6.dental and gum disease: biotene, may need to ask dental medicine to see 7.osteoarthritis left knee 8.has declined information re advance directives 9..hypertension: untreated at presentation to gyn Dr Evie Lacks. Continue tenormin. Patient aware that she should establish with a PCP. 10.gastritis likely by PET. Now on daily protonix, no symptoms since on protonix 11. Upper and lower respiratory infection: may be viral, but with rest of  situation will treat with Z pack.  12. PAC replaced by IR 12-30-15 (initial central line not in correct position) 13. Difficulty sleeping: try Restoril at 15 mg   All questions answered and patient knows to call if fever, bleeding, worsening respiratory symptoms or other concerns. Return appointment set up for Oct 9, based on anticipated date of surgery. Time spent 25 min including >50% counseling and coordination of care.  Cc Dr Brantley Stage, Dr Evie Lacks      Evlyn Clines, MD   02/22/2016, 11:59 AM

## 2016-02-24 DIAGNOSIS — C50912 Malignant neoplasm of unspecified site of left female breast: Secondary | ICD-10-CM | POA: Insufficient documentation

## 2016-03-07 NOTE — Pre-Procedure Instructions (Signed)
Maria Strickland  03/07/2016      [id number] - [place], [place] - [address] [place] [address] Phone: [phone] Fax: [phone]    Your procedure is scheduled on Tuesday, September 19th, 2017.  Report to [facility] Admitting at 10:15 A.M.   Call this number if you have problems the morning of surgery:  [phone]   Remember:  Do not eat food or drink liquids after midnight.   Take these medicines the morning of surgery with A SIP OF WATER: Acetaminophen (Tylenol) if needed, Atenolol (Tenormin), Lorazepam (Ativan) if needed, Pantoprazole (Protonix), Prochlorperazine (Compazine) if needed.    7 days prior to surgery STOP taking any Aspirin, Aleve, Naproxen, Ibuprofen, Motrin, Advil, Goody's, BC's, all herbal medications, fish oil, and all vitamins    Do not wear jewelry, make-up or nail polish.  Do not wear lotions, powders, or perfumes, or deoderant.  Do not shave 48 hours prior to surgery.    Do not bring valuables to the hospital.  [facility] is not responsible for any belongings or valuables.  Contacts, dentures or bridgework may not be worn into surgery.  Leave your suitcase in the car.  After surgery it may be brought to your room.  For patients admitted to the hospital, discharge time will be determined by your treatment team.  Patients discharged the day of surgery will not be allowed to drive home.   Special instructions:  Preparing for Surgery.   Please read over the following fact sheets that you were given.   [place]- Preparing For Surgery  Before surgery, you can play an important role. Because skin is not sterile, your skin needs to be as free of germs as possible. You can reduce the number of germs on your skin by washing with CHG (chlorahexidine gluconate) Soap before surgery.  CHG is an antiseptic cleaner which kills germs and bonds with the skin to continue killing germs even after  washing.  Please do not use if you have an allergy to CHG or antibacterial soaps. If your skin becomes reddened/irritated stop using the CHG.  Do not shave (including legs and underarms) for at least 48 hours prior to first CHG shower. It is OK to shave your face.  Please follow these instructions carefully.   1. Shower the NIGHT BEFORE SURGERY and the MORNING OF SURGERY with CHG.   2. If you chose to wash your hair, wash your hair first as usual with your normal shampoo.  3. After you shampoo, rinse your hair and body thoroughly to remove the shampoo.  4. Use CHG as you would any other liquid soap. You can apply CHG directly to the skin and wash gently with a scrungie or a clean washcloth.   5. Apply the CHG Soap to your body ONLY FROM THE NECK DOWN.  Do not use on open wounds or open sores. Avoid contact with your eyes, ears, mouth and genitals (private parts). Wash genitals (private parts) with your normal soap.  6. Wash thoroughly, paying special attention to the area where your surgery will be performed.  7. Thoroughly rinse your body with warm water from the neck down.  8. DO NOT shower/wash with your normal soap after using and rinsing off the CHG Soap.  9. Pat yourself dry with a CLEAN TOWEL.   10. Wear CLEAN PAJAMAS   11. Place CLEAN SHEETS on your bed the night of your first shower and DO NOT SLEEP WITH  PETS.  Day of Surgery: Do not apply any deodorants/lotions. Please wear clean clothes to the hospital/surgery center.

## 2016-03-08 ENCOUNTER — Encounter (HOSPITAL_COMMUNITY)
Admission: RE | Admit: 2016-03-08 | Discharge: 2016-03-08 | Disposition: A | Discharge status: Home / Self Care | Payer: Medicare PPO | Source: Ambulatory Visit | Attending: Surgery | Admitting: Surgery

## 2016-03-08 ENCOUNTER — Encounter (HOSPITAL_COMMUNITY): Payer: Self-pay

## 2016-03-08 DIAGNOSIS — Z01818 Encounter for other preprocedural examination: Secondary | ICD-10-CM | POA: Insufficient documentation

## 2016-03-08 DIAGNOSIS — C50012 Malignant neoplasm of nipple and areola, left female breast: Secondary | ICD-10-CM

## 2016-03-08 HISTORY — DX: Malignant (primary) neoplasm, unspecified: C80.1

## 2016-03-08 HISTORY — DX: Gastro-esophageal reflux disease without esophagitis: K21.9

## 2016-03-08 HISTORY — DX: Depression, unspecified: F32.A

## 2016-03-08 HISTORY — DX: Adverse effect of antineoplastic and immunosuppressive drugs, initial encounter: D64.81

## 2016-03-08 HISTORY — DX: Anxiety disorder, unspecified: F41.9

## 2016-03-08 HISTORY — DX: Adverse effect of antineoplastic and immunosuppressive drugs, initial encounter: T45.1X5A

## 2016-03-08 HISTORY — DX: Major depressive disorder, single episode, unspecified: F32.9

## 2016-03-08 LAB — CBC
HEMATOCRIT: 33.1 % — AB (ref 36.0–46.0)
HEMOGLOBIN: 11.2 g/dL — AB (ref 12.0–15.0)
MCH: 31 pg (ref 26.0–34.0)
MCHC: 33.8 g/dL (ref 30.0–36.0)
MCV: 91.7 fL (ref 78.0–100.0)
Platelets: 126 10*3/uL — ABNORMAL LOW (ref 150–400)
RBC: 3.61 MIL/uL — AB (ref 3.87–5.11)
RDW: 14.3 % (ref 11.5–15.5)
WBC: 4.4 10*3/uL (ref 4.0–10.5)

## 2016-03-08 LAB — BASIC METABOLIC PANEL
ANION GAP: 7 (ref 5–15)
BUN: 12 mg/dL (ref 6–20)
CALCIUM: 10.9 mg/dL — AB (ref 8.9–10.3)
CHLORIDE: 114 mmol/L — AB (ref 101–111)
CO2: 23 mmol/L (ref 22–32)
Creatinine, Ser: 1.06 mg/dL — ABNORMAL HIGH (ref 0.44–1.00)
GFR calc non Af Amer: 53 mL/min — ABNORMAL LOW (ref >60)
Glucose, Bld: 118 mg/dL — ABNORMAL HIGH (ref 65–99)
POTASSIUM: 3.7 mmol/L (ref 3.5–5.1)
Sodium: 144 mmol/L (ref 135–145)

## 2016-03-08 NOTE — Progress Notes (Signed)
PCP - denies - patient states that she goes to Urgent Care in [facility] or [facility] if needed Cardiologist - denies  EKG - requested from [facility] (January 2017) CXR - denies  Echo/stress test/Cardiac Cath - denies  Patient denies chest pain and shortness of breath at PAT appointment.

## 2016-03-11 NOTE — Progress Notes (Signed)
Call to [place]. Records, spoke with Pamala Hurry, she reports that the pt. Hasn't ever had an ekg in [place]. Will do DOS

## 2016-03-15 ENCOUNTER — Observation Stay (HOSPITAL_COMMUNITY)
Admission: RE | Admit: 2016-03-15 | Discharge: 2016-03-16 | Disposition: A | Discharge status: Home / Self Care | Payer: Medicare PPO | Source: Ambulatory Visit | Attending: Surgery | Admitting: Surgery

## 2016-03-15 ENCOUNTER — Encounter (HOSPITAL_COMMUNITY)
Admission: RE | Disposition: A | Discharge status: Home / Self Care | Payer: Self-pay | Source: Ambulatory Visit | Attending: Surgery

## 2016-03-15 ENCOUNTER — Encounter (HOSPITAL_COMMUNITY): Payer: Self-pay | Admitting: *Deleted

## 2016-03-15 ENCOUNTER — Ambulatory Visit (HOSPITAL_COMMUNITY): Payer: Medicare PPO | Admitting: Anesthesiology

## 2016-03-15 ENCOUNTER — Ambulatory Visit (HOSPITAL_COMMUNITY)
Admission: RE | Admit: 2016-03-15 | Discharge: 2016-03-15 | Disposition: A | Discharge status: Home / Self Care | Payer: Medicare PPO | Source: Ambulatory Visit | Attending: Surgery | Admitting: Surgery

## 2016-03-15 DIAGNOSIS — K219 Gastro-esophageal reflux disease without esophagitis: Secondary | ICD-10-CM | POA: Diagnosis not present

## 2016-03-15 DIAGNOSIS — C50412 Malignant neoplasm of upper-outer quadrant of left female breast: Secondary | ICD-10-CM | POA: Insufficient documentation

## 2016-03-15 DIAGNOSIS — C50012 Malignant neoplasm of nipple and areola, left female breast: Secondary | ICD-10-CM

## 2016-03-15 DIAGNOSIS — C50912 Malignant neoplasm of unspecified site of left female breast: Principal | ICD-10-CM | POA: Insufficient documentation

## 2016-03-15 DIAGNOSIS — I10 Essential (primary) hypertension: Secondary | ICD-10-CM | POA: Insufficient documentation

## 2016-03-15 HISTORY — PX: MASTECTOMY W/ SENTINEL NODE BIOPSY: SHX2001

## 2016-03-15 LAB — CBC
HCT: 33 % — ABNORMAL LOW (ref 36.0–46.0)
HEMOGLOBIN: 10.8 g/dL — AB (ref 12.0–15.0)
MCH: 30.6 pg (ref 26.0–34.0)
MCHC: 32.7 g/dL (ref 30.0–36.0)
MCV: 93.5 fL (ref 78.0–100.0)
Platelets: 108 10*3/uL — ABNORMAL LOW (ref 150–400)
RBC: 3.53 MIL/uL — ABNORMAL LOW (ref 3.87–5.11)
RDW: 14 % (ref 11.5–15.5)
WBC: 6.7 10*3/uL (ref 4.0–10.5)

## 2016-03-15 LAB — CREATININE, SERUM

## 2016-03-15 SURGERY — MASTECTOMY WITH SENTINEL LYMPH NODE BIOPSY
Anesthesia: Regional | Site: Breast | Laterality: Left

## 2016-03-15 MED ORDER — ONDANSETRON HCL 4 MG/2ML IJ SOLN
4.0000 mg | Freq: Four times a day (QID) | INTRAMUSCULAR | Status: DC | PRN
  Administered 2016-03-15: 4 mg via INTRAVENOUS
  Filled 2016-03-15: qty 2

## 2016-03-15 MED ORDER — PANTOPRAZOLE SODIUM 40 MG PO TBEC
40.0000 mg | DELAYED_RELEASE_TABLET | Freq: Every day | ORAL | Status: DC
Start: 2016-03-16 — End: 2016-03-16
  Administered 2016-03-16: 40 mg via ORAL
  Filled 2016-03-15: qty 1

## 2016-03-15 MED ORDER — FENTANYL CITRATE (PF) 100 MCG/2ML IJ SOLN
INTRAMUSCULAR | Status: DC | PRN
  Administered 2016-03-15 (×2): 50 ug via INTRAVENOUS

## 2016-03-15 MED ORDER — KCL IN DEXTROSE-NACL 20-5-0.9 MEQ/L-%-% IV SOLN
INTRAVENOUS | Status: DC
  Administered 2016-03-15: 20:00:00 via INTRAVENOUS
  Filled 2016-03-15: qty 1000

## 2016-03-15 MED ORDER — DEXTROSE 5 % IV SOLN
3.0000 g | INTRAVENOUS | Status: AC
  Administered 2016-03-15: 3 g via INTRAVENOUS
  Filled 2016-03-15 (×2): qty 3000

## 2016-03-15 MED ORDER — ONDANSETRON HCL 4 MG/2ML IJ SOLN
INTRAMUSCULAR | Status: DC | PRN
  Administered 2016-03-15: 4 mg via INTRAVENOUS

## 2016-03-15 MED ORDER — MIDAZOLAM HCL 2 MG/2ML IJ SOLN
1.0000 mg | Freq: Once | INTRAMUSCULAR | Status: AC
  Administered 2016-03-15: 1 mg via INTRAVENOUS

## 2016-03-15 MED ORDER — FENTANYL CITRATE (PF) 100 MCG/2ML IJ SOLN
INTRAMUSCULAR | Status: AC
  Filled 2016-03-15: qty 2

## 2016-03-15 MED ORDER — FENTANYL CITRATE (PF) 100 MCG/2ML IJ SOLN
50.0000 ug | Freq: Once | INTRAMUSCULAR | Status: AC
  Administered 2016-03-15: 50 ug via INTRAVENOUS

## 2016-03-15 MED ORDER — ARTIFICIAL TEARS OP OINT
TOPICAL_OINTMENT | OPHTHALMIC | Status: AC
  Filled 2016-03-15: qty 3.5

## 2016-03-15 MED ORDER — GLYCOPYRROLATE 0.2 MG/ML IV SOSY
PREFILLED_SYRINGE | INTRAVENOUS | Status: AC
  Filled 2016-03-15: qty 3

## 2016-03-15 MED ORDER — SODIUM CHLORIDE 0.9 % IJ SOLN
INTRAMUSCULAR | Status: AC
  Filled 2016-03-15: qty 10

## 2016-03-15 MED ORDER — ACETAMINOPHEN 500 MG PO TABS
1000.0000 mg | ORAL_TABLET | ORAL | Status: DC | PRN
  Administered 2016-03-16: 1000 mg via ORAL
  Filled 2016-03-15: qty 2

## 2016-03-15 MED ORDER — CEFAZOLIN SODIUM-DEXTROSE 2-4 GM/100ML-% IV SOLN
2.0000 g | Freq: Three times a day (TID) | INTRAVENOUS | Status: AC
  Administered 2016-03-16: 2 g via INTRAVENOUS
  Filled 2016-03-15: qty 100

## 2016-03-15 MED ORDER — GABAPENTIN 300 MG PO CAPS
300.0000 mg | ORAL_CAPSULE | ORAL | Status: AC
  Administered 2016-03-15: 300 mg via ORAL
  Filled 2016-03-15: qty 1

## 2016-03-15 MED ORDER — LIDOCAINE 2% (20 MG/ML) 5 ML SYRINGE
INTRAMUSCULAR | Status: AC
  Filled 2016-03-15: qty 5

## 2016-03-15 MED ORDER — HYDROMORPHONE HCL 1 MG/ML IJ SOLN
INTRAMUSCULAR | Status: AC
  Filled 2016-03-15: qty 1

## 2016-03-15 MED ORDER — OXYCODONE HCL 5 MG PO TABS: 5.0000 mg | ORAL_TABLET | ORAL | Status: DC | PRN

## 2016-03-15 MED ORDER — LIDOCAINE HCL (CARDIAC) 20 MG/ML IV SOLN
INTRAVENOUS | Status: DC | PRN
  Administered 2016-03-15: 20 mg via INTRATRACHEAL

## 2016-03-15 MED ORDER — CHLORHEXIDINE GLUCONATE CLOTH 2 % EX PADS: 6.0000 | MEDICATED_PAD | Freq: Once | CUTANEOUS | Status: DC

## 2016-03-15 MED ORDER — ATENOLOL 50 MG PO TABS
50.0000 mg | ORAL_TABLET | Freq: Every day | ORAL | Status: DC
  Administered 2016-03-16: 50 mg via ORAL
  Filled 2016-03-15: qty 1

## 2016-03-15 MED ORDER — PROPOFOL 10 MG/ML IV BOLUS
INTRAVENOUS | Status: DC | PRN
  Administered 2016-03-15: 200 mg via INTRAVENOUS

## 2016-03-15 MED ORDER — PHENYLEPHRINE HCL 10 MG/ML IJ SOLN
INTRAMUSCULAR | Status: DC | PRN
  Administered 2016-03-15: 50 ug/min via INTRAVENOUS

## 2016-03-15 MED ORDER — ONDANSETRON 4 MG PO TBDP: 4.0000 mg | ORAL_TABLET | Freq: Four times a day (QID) | ORAL | Status: DC | PRN

## 2016-03-15 MED ORDER — FENTANYL CITRATE (PF) 100 MCG/2ML IJ SOLN
INTRAMUSCULAR | Status: AC
  Administered 2016-03-15: 50 ug via INTRAVENOUS
  Filled 2016-03-15: qty 2

## 2016-03-15 MED ORDER — BUPIVACAINE-EPINEPHRINE (PF) 0.5% -1:200000 IJ SOLN
INTRAMUSCULAR | Status: DC | PRN
  Administered 2016-03-15: 25 mL

## 2016-03-15 MED ORDER — LACTATED RINGERS IV SOLN
INTRAVENOUS | Status: DC | PRN
  Administered 2016-03-15: 13:00:00 via INTRAVENOUS

## 2016-03-15 MED ORDER — HYDROMORPHONE HCL 1 MG/ML IJ SOLN
INTRAMUSCULAR | Status: DC | PRN
  Administered 2016-03-15 (×2): 0.5 mg via INTRAVENOUS

## 2016-03-15 MED ORDER — PHENYLEPHRINE 40 MCG/ML (10ML) SYRINGE FOR IV PUSH (FOR BLOOD PRESSURE SUPPORT)
PREFILLED_SYRINGE | INTRAVENOUS | Status: AC
  Filled 2016-03-15: qty 10

## 2016-03-15 MED ORDER — GLYCOPYRROLATE 0.2 MG/ML IJ SOLN
INTRAMUSCULAR | Status: DC | PRN
  Administered 2016-03-15 (×2): 0.1 mg via INTRAVENOUS

## 2016-03-15 MED ORDER — SODIUM CHLORIDE 0.9 % IJ SOLN
INTRAMUSCULAR | Status: DC | PRN
  Administered 2016-03-15: 3 mL

## 2016-03-15 MED ORDER — LACTATED RINGERS IV SOLN
Freq: Once | INTRAVENOUS | Status: AC
  Administered 2016-03-15: 11:00:00 via INTRAVENOUS

## 2016-03-15 MED ORDER — LORAZEPAM 1 MG PO TABS: 1.0000 mg | ORAL_TABLET | Freq: Three times a day (TID) | ORAL | Status: DC | PRN

## 2016-03-15 MED ORDER — METHYLENE BLUE 0.5 % INJ SOLN
INTRAVENOUS | Status: AC
  Filled 2016-03-15: qty 10

## 2016-03-15 MED ORDER — DIPHENHYDRAMINE HCL 12.5 MG/5ML PO ELIX: 12.5000 mg | ORAL_SOLUTION | Freq: Four times a day (QID) | ORAL | Status: DC | PRN

## 2016-03-15 MED ORDER — CELECOXIB 200 MG PO CAPS
400.0000 mg | ORAL_CAPSULE | ORAL | Status: AC
  Administered 2016-03-15: 400 mg via ORAL
  Filled 2016-03-15: qty 2

## 2016-03-15 MED ORDER — ACETAMINOPHEN 500 MG PO TABS
1000.0000 mg | ORAL_TABLET | ORAL | Status: AC
  Administered 2016-03-15: 1000 mg via ORAL
  Filled 2016-03-15: qty 2

## 2016-03-15 MED ORDER — PROPOFOL 10 MG/ML IV BOLUS
INTRAVENOUS | Status: AC
  Filled 2016-03-15: qty 20

## 2016-03-15 MED ORDER — SUCCINYLCHOLINE CHLORIDE 200 MG/10ML IV SOSY
PREFILLED_SYRINGE | INTRAVENOUS | Status: AC
  Filled 2016-03-15: qty 10

## 2016-03-15 MED ORDER — PHENYLEPHRINE HCL 10 MG/ML IJ SOLN
INTRAMUSCULAR | Status: DC | PRN
  Administered 2016-03-15: 120 ug via INTRAVENOUS
  Administered 2016-03-15: 80 ug via INTRAVENOUS
  Administered 2016-03-15: 120 ug via INTRAVENOUS
  Administered 2016-03-15: 80 ug via INTRAVENOUS

## 2016-03-15 MED ORDER — SODIUM CHLORIDE 0.9 % IJ SOLN
INTRAVENOUS | Status: DC | PRN
  Administered 2016-03-15: 5 mL

## 2016-03-15 MED ORDER — ENOXAPARIN SODIUM 40 MG/0.4ML ~~LOC~~ SOLN
40.0000 mg | SUBCUTANEOUS | Status: DC
  Administered 2016-03-16: 40 mg via SUBCUTANEOUS
  Filled 2016-03-15: qty 0.4

## 2016-03-15 MED ORDER — 0.9 % SODIUM CHLORIDE (POUR BTL) OPTIME
TOPICAL | Status: DC | PRN
  Administered 2016-03-15: 1000 mL

## 2016-03-15 MED ORDER — MIDAZOLAM HCL 2 MG/2ML IJ SOLN
INTRAMUSCULAR | Status: DC | PRN
  Administered 2016-03-15 (×2): 1 mg via INTRAVENOUS

## 2016-03-15 MED ORDER — ONDANSETRON HCL 4 MG/2ML IJ SOLN
INTRAMUSCULAR | Status: AC
  Filled 2016-03-15: qty 2

## 2016-03-15 MED ORDER — DIPHENHYDRAMINE HCL 50 MG/ML IJ SOLN: 12.5000 mg | Freq: Four times a day (QID) | INTRAMUSCULAR | Status: DC | PRN

## 2016-03-15 MED ORDER — TEMAZEPAM 15 MG PO CAPS: 15.0000 mg | ORAL_CAPSULE | Freq: Every evening | ORAL | Status: DC | PRN

## 2016-03-15 MED ORDER — MIDAZOLAM HCL 2 MG/2ML IJ SOLN
INTRAMUSCULAR | Status: AC
  Administered 2016-03-15: 1 mg via INTRAVENOUS
  Filled 2016-03-15: qty 2

## 2016-03-15 MED ORDER — TECHNETIUM TC 99M SULFUR COLLOID FILTERED
1.0000 | Freq: Once | INTRAVENOUS | Status: AC | PRN
  Administered 2016-03-15: 1 via INTRADERMAL

## 2016-03-15 MED ORDER — MIDAZOLAM HCL 2 MG/2ML IJ SOLN
INTRAMUSCULAR | Status: AC
  Filled 2016-03-15: qty 2

## 2016-03-15 MED ORDER — HYDROMORPHONE HCL 1 MG/ML IJ SOLN: 1.0000 mg | INTRAMUSCULAR | Status: DC | PRN

## 2016-03-15 SURGICAL SUPPLY — 41 items
APPLIER CLIP 9.375 MED OPEN (MISCELLANEOUS) ×3
BINDER BREAST LRG (GAUZE/BANDAGES/DRESSINGS) ×3 IMPLANT
BINDER BREAST XLRG (GAUZE/BANDAGES/DRESSINGS) IMPLANT
CANISTER SUCTION 2500CC (MISCELLANEOUS) ×3 IMPLANT
CHLORAPREP W/TINT 26ML (MISCELLANEOUS) ×3 IMPLANT
CLIP APPLIE 9.375 MED OPEN (MISCELLANEOUS) ×1 IMPLANT
CONT SPEC 4OZ CLIKSEAL STRL BL (MISCELLANEOUS) ×6 IMPLANT
COVER PROBE W GEL 5X96 (DRAPES) ×3 IMPLANT
COVER SURGICAL LIGHT HANDLE (MISCELLANEOUS) ×3 IMPLANT
DRAIN CHANNEL 19F RND (DRAIN) ×3 IMPLANT
DRAPE LAPAROSCOPIC ABDOMINAL (DRAPES) ×3 IMPLANT
DRAPE UTILITY XL STRL (DRAPES) ×6 IMPLANT
ELECT CAUTERY BLADE 6.4 (BLADE) ×3 IMPLANT
ELECT REM PT RETURN 9FT ADLT (ELECTROSURGICAL) ×3
ELECTRODE REM PT RTRN 9FT ADLT (ELECTROSURGICAL) ×1 IMPLANT
EVACUATOR SILICONE 100CC (DRAIN) ×3 IMPLANT
GLOVE BIO SURGEON STRL SZ8 (GLOVE) ×3 IMPLANT
GLOVE BIOGEL PI IND STRL 8 (GLOVE) ×1 IMPLANT
GLOVE BIOGEL PI INDICATOR 8 (GLOVE) ×2
GOWN STRL REUS W/ TWL LRG LVL3 (GOWN DISPOSABLE) ×3 IMPLANT
GOWN STRL REUS W/ TWL XL LVL3 (GOWN DISPOSABLE) ×1 IMPLANT
GOWN STRL REUS W/TWL LRG LVL3 (GOWN DISPOSABLE) ×6
GOWN STRL REUS W/TWL XL LVL3 (GOWN DISPOSABLE) ×2
KIT BASIN OR (CUSTOM PROCEDURE TRAY) ×3 IMPLANT
KIT ROOM TURNOVER OR (KITS) ×3 IMPLANT
LIQUID BAND (GAUZE/BANDAGES/DRESSINGS) ×3 IMPLANT
NEEDLE 18GX1X1/2 (RX/OR ONLY) (NEEDLE) ×3 IMPLANT
NEEDLE FILTER BLUNT 18X 1/2SAF (NEEDLE) ×2
NEEDLE FILTER BLUNT 18X1 1/2 (NEEDLE) ×1 IMPLANT
NEEDLE HYPO 25GX1X1/2 BEV (NEEDLE) ×3 IMPLANT
NS IRRIG 1000ML POUR BTL (IV SOLUTION) ×3 IMPLANT
PACK GENERAL/GYN (CUSTOM PROCEDURE TRAY) ×3 IMPLANT
PAD ARMBOARD 7.5X6 YLW CONV (MISCELLANEOUS) ×3 IMPLANT
SPECIMEN JAR X LARGE (MISCELLANEOUS) ×3 IMPLANT
SPONGE GAUZE 4X4 12PLY STER LF (GAUZE/BANDAGES/DRESSINGS) ×3 IMPLANT
SUT ETHILON 3 0 FSL (SUTURE) ×3 IMPLANT
SUT MNCRL AB 4-0 PS2 18 (SUTURE) ×3 IMPLANT
SUT VIC AB 3-0 SH 18 (SUTURE) ×3 IMPLANT
SYR CONTROL 10ML LL (SYRINGE) ×3 IMPLANT
TOWEL OR 17X24 6PK STRL BLUE (TOWEL DISPOSABLE) ×3 IMPLANT
TOWEL OR 17X26 10 PK STRL BLUE (TOWEL DISPOSABLE) ×3 IMPLANT

## 2016-03-15 NOTE — Transfer of Care (Signed)
Immediate Anesthesia Transfer of Care Note  Patient: Maria Strickland  Procedure(s) Performed: Procedure(s): LEFT SIMPLE MASTECTOMY WITH SENTINEL LYMPH NODE MAPPING (Left)  Patient Location: PACU  Anesthesia Type:General and Regional  Level of Consciousness: awake, alert  and patient cooperative  Airway & Oxygen Therapy: Patient Spontanous Breathing and Patient connected to nasal cannula oxygen  Post-op Assessment: Report given to RN, Post -op Vital signs reviewed and stable, Patient moving all extremities X 4 and Patient able to stick tongue midline  Post vital signs: Reviewed and stable  Last Vitals:  Vitals:   03/15/16 1140 03/15/16 1420  BP: (!) 152/74 (!) (P) 147/88  Pulse: 71 (P) 62  Resp: (!) 22 (P) 13  Temp:  (P) 36.5 C    Last Pain:  Vitals:   03/15/16 1028  TempSrc: Oral      Patients Stated Pain Goal: 3 ([id number])  Complications: No apparent anesthesia complications

## 2016-03-15 NOTE — Op Note (Signed)
Maria Strickland 02-13-1946 [id number] 03/15/2016   Preoperative diagnosis: left breast cancer stage 2  Postoperative diagnosis: same  Procedure: Left simple mastectomy and left sentinel lymph node mapping with  methylene blue dye   Surgeon: Erroll Luna A.  Assistant surgeon: Hitchcock RNFA   Anesthesia:GA combined with regional for post-op pain  Clinical History and Indications:The patient is seen in the holding area and we reviewed the plans for the procedure as noted above. We reviewed the risks and complications a final time. She had no further questions. I marked theleft side as the operative side.  Description of Procedure: The patient was taken to the operating room. After satisfactory general anesthesia was obtained the timeout was done.   I then injected 5 cc of dilute methylene blue and injected it subareaorly and massaged it in. A full prep and drape was then done.  I outlined an elliptical incision and marked the inframammary fold and midline. The incision was made. The usual skin flaps were raised. I went to the clavicle superiorly and sternum medially and towards the latissimus laterally.    After the superior flap was complete I was able to use the neoprobe to locate a sentinel node.  3 Hot and blue SLN were removed from level 1 and 2 basin.  Background counts approached zero.   Once the node was removed it was forwarded to pathology.   The inferior flap was then made going to the inframammary fold and out to the latissimus. The breast was then removed from the pectoralis starting medially and working laterally. When I got to the lateral aspect of the pectoralis major muscle I opened the clavipectoral fascia. I finished removing the breast from the serratus muscles.   I therefore completed the mastectomy by dividing the remaining attachments from the breast to the chest wall and latissimus but not taking any more tissue from the axilla. The specimen was marked to orient  it for the pathologist and passed off the table.  I spent several minutes irrigating and making sure everything was dry. I then placed a 40 [place] Blake drain through a small incision in the inferior flap and laid along the pectoralis muscle. It was secured with a 2-0 nylon suture.  Another irrigation and check for hemostasis was made. Everything appeared to be dry. Incision was then closed with 3 O vicryl and 4 0 monocryl.  Liquid adhesive applied .   The patient tolerated the procedure well. There were no operative complications. All counts were correct. Estimated blood loss was  50 cc. Sterile dressings were applied and the patient taken to the PACU in satisfactory condition.  Turner Daniels , MD, FACS 03/15/2016 1:55 PM

## 2016-03-15 NOTE — Interval H&P Note (Signed)
History and Physical Interval Note:  03/15/2016 12:05 PM  Maria Strickland  has presented today for surgery, with the diagnosis of LEFT BREAST CANCER  The various methods of treatment have been discussed with the patient and family. After consideration of risks, benefits and other options for treatment, the patient has consented to  Procedure(s): LEFT SIMPLE MASTECTOMY WITH SENTINEL [place] (Left) as a surgical intervention .  The patient's history has been reviewed, patient examined, no change in status, stable for surgery.  I have reviewed the patient's chart and labs.  Questions were answered to the patient's satisfaction.     Aymen Widrig A.

## 2016-03-15 NOTE — Anesthesia Procedure Notes (Signed)
Performed by: FITZGERALD, ROBERT       

## 2016-03-15 NOTE — H&P (View-Only) (Signed)
Maria Strickland 02/18/2016 2:04 PM Location: [facility] Patient #: [id number] DOB: 1945/08/30 Married / Language: English / Race: Black or African American Female  History of Present Illness Maria Moores A. Sharetta Ricchio MD; 02/18/2016 5:03 PM) Patient words: [name] Patient was diagnosed and treated in in March 2017. She had a large centrally located mass of her left breast that caused nipple retraction. 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 She has been on neoadjuvant chemotherapy with modest reduction in size of her left breast cancer. She is sent at the request of medical oncology for this and discussion of surgical management. She states she thinks the area is slightly smaller but not much. She is received multiple rounds of chemotherapy via a Port-A-Cath. 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  The patient is a 70 year old female.   Allergies Elbert Ewings, [place]; 02/18/2016 2:07 PM) No Known Drug Allergies 02/18/2016  Medication History Elbert Ewings, [place]; 02/18/2016 2:08 PM) Pantoprazole Sodium (40MG  Tablet DR, Oral) Active. Tylenol (500MG  Capsule, Oral) Active. Coenzyme Q10 (100MG  Capsule, Oral) Active. Vitamin D (Cholecalciferol) (1000UNIT Tablet, Oral) Active. Atenolol (50MG  Tablet, Oral) Active. LORazepam (1MG  Tablet, Oral) Active. Prochlorperazine Maleate (10MG  Tablet, Oral) Active. Lidocaine-Prilocaine (2.5-2.5% Cream, External) Active. Medications Reconciled     Review of Systems (Honor Frison A. Jamica Woodyard MD; 02/18/2016 5:06 PM) General Not Present- Appetite Loss, Chills, Fatigue, Fever, Night Sweats, Weight Gain and Weight Loss. Skin Not Present- Change in Wart/Mole, Dryness, Hives, Jaundice, New Lesions, Non-Healing Wounds, Rash and Ulcer. HEENT Not Present- Earache, Hearing Loss, Hoarseness, Nose Bleed, Oral Ulcers, Ringing in the Ears, Seasonal Allergies, Sinus Pain,  Sore Throat, Visual Disturbances, Wears glasses/contact lenses and Yellow Eyes. Respiratory Not Present- Bloody sputum, Chronic Cough, Difficulty Breathing, Snoring and Wheezing. Breast Note: left breast mass inverted nipple  Cardiovascular Not Present- Chest Pain, Difficulty Breathing Lying Down, Leg Cramps, Palpitations, Rapid Heart Rate, Shortness of Breath and Swelling of Extremities. Female Genitourinary Not Present- Frequency, Nocturia, Painful Urination, Pelvic Pain and Urgency. Musculoskeletal Not Present- Back Pain, Joint Pain, Joint Stiffness, Muscle Pain, Muscle Weakness and Swelling of Extremities. Neurological Not Present- Decreased Memory, Fainting, Headaches, Numbness, Seizures, Tingling, Tremor, Trouble walking and Weakness. Psychiatric Not Present- Anxiety, Bipolar, Change in Sleep Pattern, Depression, Fearful and Frequent crying. Endocrine Not Present- Cold Intolerance, Excessive Hunger, Hair Changes, Heat Intolerance, Hot flashes and New Diabetes. Hematology Not Present- Easy Bruising, Excessive bleeding, Gland problems, HIV and Persistent Infections.  Vitals Elbert Ewings CMA; 02/18/2016 2:08 PM) 02/18/2016 2:08 PM Weight: 187 lb Height: 64in Body Surface Area: 1.9 m Body Mass Index: 32.1 kg/m  Temp.: 97.76F(Temporal)  Pulse: 80 (Regular)  BP: 142/86 (Sitting, Left Arm, Standard)      Physical Exam (Amyriah Buras A. Darrah Dredge MD; 02/18/2016 5:04 PM)  General Mental Status-Alert. General Appearance-Consistent with stated age. Hydration-Well hydrated. Voice-Normal.  Head and Neck Head-normocephalic, atraumatic with no lesions or palpable masses. Trachea-midline. Thyroid Gland Characteristics - normal size and consistency.  Eye Eyeball - Bilateral-Extraocular movements intact. Sclera/Conjunctiva - Bilateral-No scleral icterus.  Chest and Lung Exam Chest and lung exam reveals -quiet, even and easy respiratory effort with no use of  accessory muscles and on auscultation, normal breath sounds, no adventitious sounds and normal vocal resonance. Inspection Chest Wall - Normal. Back - normal.  Breast Note: Large central located left breast cancer with inversion of left nipple. Gross measurements about 4 cm x 4 cm. No left axillary adenopathy. Right  breast is normal.  Neurologic Neurologic evaluation reveals -alert and oriented x 3 with no impairment of recent or remote memory. Mental Status-Normal.  Musculoskeletal Normal Exam - Left-Upper Extremity Strength Normal and Lower Extremity Strength Normal. Normal Exam - Right-Upper Extremity Strength Normal and Lower Extremity Strength Normal.  Lymphatic Head & Neck  General Head & Neck Lymphatics: Bilateral - Description - Normal. Axillary  General Axillary Region: Bilateral - Description - Normal. Tenderness - Non Tender.    Assessment & Plan (Sheriann Newmann A. Luticia Tadros MD; 02/18/2016 5:05 PM)  BREAST CANCER, LEFT (C50.912) Impression: Patient is responded minimally to neoadjuvant chemotherapy. Recommend a left breast mastectomy. Recommend left breast sentinel lymph node mapping. She is not a good lumpectomy candidate due to large size of tumor, central location in significant cosmetic deformity from breast conservation. Discussed reconstruction. Patient would like to wait on that. Discussed treatment options for breast cancer to include breast conservation vs mastectomy with reconstruction. Pt has decided on mastectomy. Risk include bleeding, infection, flap necrosis, pain, numbness, recurrence, hematoma, other surgery needs. Pt understands and agrees to proceed. Risk of sentinel lymph node mapping include bleeding, infection, lymphedema, shoulder pain. stiffness, dye allergy. cosmetic deformity , blood clots, death, need for more surgery. Pt agres to proceed.  Current Plans You are being scheduled for surgery - Our schedulers will call you.  You should hear from our  office's scheduling department within 5 working days about the location, date, and time of surgery. We try to make accommodations for patient's preferences in scheduling surgery, but sometimes the OR schedule or the surgeon's schedule prevents [place] from making those accommodations.  If you have not heard from our office [phone]) in 5 working days, call the office and ask for your surgeon's nurse.  If you have other questions about your diagnosis, plan, or surgery, call the office and ask for your surgeon's nurse.  Pt Education - Pamphlet Given - Breast Biopsy: discussed with patient and provided information. Pt Education - CCS Mastectomy HCI

## 2016-03-15 NOTE — Anesthesia Procedure Notes (Signed)
Anesthesia Regional Block:  Pectoralis block  Pre-Anesthetic Checklist: ,, timeout performed, Correct Patient, Correct Site, Correct Laterality, Correct Procedure, Correct Position, site marked, Risks and benefits discussed,  Surgical consent,  Pre-op evaluation,  At surgeon's request and post-op pain management  Laterality: Left  Prep: chloraprep       Needles:   Needle Type: Echogenic Needle     Needle Length: 9cm 9 cm Needle Gauge: 21 and 21 G    Additional Needles:  Procedures: ultrasound guided (picture in chart) Pectoralis block Narrative:  Start time: 03/15/2016 11:13 AM End time: 03/15/2016 11:20 AM Injection made incrementally with aspirations every 5 mL.  Performed by: Personally  Anesthesiologist: Suzette Battiest

## 2016-03-15 NOTE — Progress Notes (Signed)
Patient OOB ambulatory to bathroom. Voiding without difficulty.

## 2016-03-15 NOTE — Anesthesia Preprocedure Evaluation (Addendum)
Anesthesia Evaluation  Patient identified by MRN, date of birth, ID band Patient awake    Reviewed: Allergy & Precautions, NPO status , Patient's Chart, lab work & pertinent test results, reviewed documented beta blocker date and time   Airway Mallampati: II  TM Distance: >3 FB Neck ROM: Full    Dental  (+) Dental Advisory Given   Pulmonary neg pulmonary ROS,    breath sounds clear to auscultation       Cardiovascular hypertension, Pt. on medications and Pt. on home beta blockers  Rhythm:Regular Rate:Normal     Neuro/Psych Anxiety Depression negative neurological ROS     GI/Hepatic Neg liver ROS, GERD  ,  Endo/Other  negative endocrine ROS  Renal/GU negative Renal ROS     Musculoskeletal   Abdominal   Peds  Hematology  (+) anemia ,   Anesthesia Other Findings   Reproductive/Obstetrics                            Lab Results  Component Value Date   WBC 4.4 03/08/2016   HGB 11.2 (L) 03/08/2016   HCT 33.1 (L) 03/08/2016   MCV 91.7 03/08/2016   PLT 126 (L) 03/08/2016   Lab Results  Component Value Date   CREATININE 1.06 (H) 03/08/2016   BUN 12 03/08/2016   NA 144 03/08/2016   K 3.7 03/08/2016   CL 114 (H) 03/08/2016   CO2 23 03/08/2016    Anesthesia Physical Anesthesia Plan  ASA: II  Anesthesia Plan: General and Regional   Post-op Pain Management: GA combined w/ Regional for post-op pain   Induction: Intravenous  Airway Management Planned: LMA  Additional Equipment:   Intra-op Plan:   Post-operative Plan: Extubation in OR  Informed Consent: I have reviewed the patients History and Physical, chart, labs and discussed the procedure including the risks, benefits and alternatives for the proposed anesthesia with the patient or authorized representative who has indicated his/her understanding and acceptance.   Dental advisory given  Plan Discussed with: CRNA  Anesthesia  Plan Comments:         Anesthesia Quick Evaluation

## 2016-03-16 ENCOUNTER — Encounter (HOSPITAL_COMMUNITY): Payer: Self-pay | Admitting: Surgery

## 2016-03-16 DIAGNOSIS — C50912 Malignant neoplasm of unspecified site of left female breast: Secondary | ICD-10-CM | POA: Diagnosis not present

## 2016-03-16 LAB — CBC
HEMATOCRIT: 30.1 % — AB (ref 36.0–46.0)
Hemoglobin: 9.9 g/dL — ABNORMAL LOW (ref 12.0–15.0)
MCH: 30.6 pg (ref 26.0–34.0)
MCHC: 32.9 g/dL (ref 30.0–36.0)
MCV: 92.9 fL (ref 78.0–100.0)
Platelets: 108 10*3/uL — ABNORMAL LOW (ref 150–400)
RBC: 3.24 MIL/uL — ABNORMAL LOW (ref 3.87–5.11)
RDW: 14 % (ref 11.5–15.5)
WBC: 4.4 10*3/uL (ref 4.0–10.5)

## 2016-03-16 LAB — COMPREHENSIVE METABOLIC PANEL
ALBUMIN: 3.1 g/dL — AB (ref 3.5–5.0)
ALT: 8 U/L — ABNORMAL LOW (ref 14–54)
ANION GAP: 4 — AB (ref 5–15)
AST: 14 U/L — ABNORMAL LOW (ref 15–41)
Alkaline Phosphatase: 52 U/L (ref 38–126)
BILIRUBIN TOTAL: 1.1 mg/dL (ref 0.3–1.2)
BUN: 13 mg/dL (ref 6–20)
CO2: 23 mmol/L (ref 22–32)
Calcium: 10.3 mg/dL (ref 8.9–10.3)
Chloride: 116 mmol/L — ABNORMAL HIGH (ref 101–111)
Creatinine, Ser: 1.05 mg/dL — ABNORMAL HIGH (ref 0.44–1.00)
GFR calc Af Amer: >60 mL/min (ref >60)
GFR calc non Af Amer: 53 mL/min — ABNORMAL LOW (ref >60)
GLUCOSE: 106 mg/dL — AB (ref 65–99)
POTASSIUM: 4.2 mmol/L (ref 3.5–5.1)
Sodium: 143 mmol/L (ref 135–145)
TOTAL PROTEIN: 5.5 g/dL — AB (ref 6.5–8.1)

## 2016-03-16 MED ORDER — OXYCODONE HCL 5 MG PO TABS: 5.0000 mg | ORAL_TABLET | ORAL | 0 refills | Status: DC | PRN

## 2016-03-16 NOTE — Discharge Summary (Signed)
Physician Discharge Summary  Patient ID: Maria Strickland MRN: [id number] DOB/AGE: 69-Jan-1947 69 y.o.  Admit date: 03/15/2016 Discharge date: 03/16/2016  Admission Diagnoses:left breast cancer   Discharge Diagnoses:  Active Problems:   Breast cancer of upper-outer quadrant of left female breast [facility])   Discharged Condition: good  Hospital Course: Pt doing well.  She ambulated without difficulty and flaps were viable.   No hematoma   And flaps viable. She was tolerating her diet and had good pain control.  Output  Was bloody serous in nature.   Consults: None  Significant Diagnostic Studies: labs:  CBC    Component Value Date/Time   WBC 4.4 03/16/2016 0542   RBC 3.24 (L) 03/16/2016 0542   HGB 9.9 (L) 03/16/2016 0542   HGB 10.0 (L) 02/22/2016 1109   HCT 30.1 (L) 03/16/2016 0542   HCT 29.0 (L) 02/22/2016 1109   PLT 108 (L) 03/16/2016 0542   PLT 60 (L) 02/22/2016 1109   MCV 92.9 03/16/2016 0542   MCV 91.8 02/22/2016 1109   MCH 30.6 03/16/2016 0542   MCHC 32.9 03/16/2016 0542   RDW 14.0 03/16/2016 0542   RDW 14.8 (H) 02/22/2016 1109   LYMPHSABS 0.8 (L) 02/22/2016 1109   MONOABS 0.2 02/22/2016 1109   EOSABS 0.1 02/22/2016 1109   BASOSABS 0.0 02/22/2016 1109    Treatments: surgery: left simple mastectomy   Discharge Exam: Blood pressure 108/83, pulse 64, temperature 97.8 F (36.6 C), temperature source Oral, resp. rate 18, height 5\' 3"  (1.6 m), weight 100 kg (220 lb 7.4 oz), SpO2 99 %. Incision/Wound:flaps viable   No hematoma   Disposition: Final discharge disposition not confirmed  Discharge Instructions    Call MD for:    Complete by:  As directed    Call MD for:  persistant dizziness or light-headedness    Complete by:  As directed    Call MD for:  persistant nausea and vomiting    Complete by:  As directed    Call MD for:  redness, tenderness, or signs of infection (pain, swelling, redness, odor or green/yellow discharge around incision site)    Complete by:  As  directed    Call MD for:  severe uncontrolled pain    Complete by:  As directed    Call MD for:  temperature >100.4    Complete by:  As directed    Diet - low sodium heart healthy    Complete by:  As directed    Increase activity slowly    Complete by:  As directed        Medication List    TAKE these medications   acetaminophen 500 MG tablet Commonly known as:  TYLENOL Take 1,000 mg by mouth every 4 (four) hours as needed for mild pain.   atenolol 50 MG tablet Commonly known as:  TENORMIN Take 1 tablet (50 mg total) by mouth daily.   azithromycin 250 MG tablet Commonly known as:  ZITHROMAX Z-PAK Take  as directed.   lidocaine-prilocaine cream Commonly known as:  EMLA Apply 1 application topically as needed. Apply 1-2 hours prior to [facility] access   LORazepam 1 MG tablet Commonly known as:  ATIVAN Take 1 tablet (1 mg total) by mouth every 8 (eight) hours as needed. for anxiety and at bedtime   magic mouthwash Soln Take 5 mLs by mouth 3 (three) times daily as needed for mouth pain. Reported on 12/21/2015   oxyCODONE 5 MG immediate release tablet Commonly known as:  Oxy IR/ROXICODONE  Take 1-2 tablets (5-10 mg total) by mouth every 4 (four) hours as needed for moderate pain.   pantoprazole 40 MG tablet Commonly known as:  PROTONIX Take 1 tablet (40 mg total) by mouth daily.   prochlorperazine 10 MG tablet Commonly known as:  COMPAZINE Take 1 tablet (10 mg total) by mouth every 6 (six) hours as needed for nausea or vomiting.   temazepam 15 MG capsule Commonly known as:  RESTORIL Take 1 capsule (15 mg total) by mouth at bedtime as needed for sleep.   Vitamin D3 5000 units Caps Take 5,000 Units by mouth daily.        Signed: Kolin Erdahl A. 03/16/2016, 9:02 AM

## 2016-03-16 NOTE — Discharge Instructions (Signed)
[facility] [place] °[phone] ° °MASTECTOMY: POST OP INSTRUCTIONS ° °Always review your discharge instruction sheet given to you by the facility where your surgery was performed. °IF YOU HAVE DISABILITY OR FAMILY LEAVE FORMS, YOU MUST BRING THEM TO THE OFFICE FOR PROCESSING.   °DO NOT GIVE THEM TO YOUR DOCTOR. °A prescription for pain medication may be given to you upon discharge.  Take your pain medication as prescribed, if needed.  If narcotic pain medicine is not needed, then you may take acetaminophen (Tylenol) or ibuprofen (Advil) as needed. °1. Take your usually prescribed medications unless otherwise directed. °2. If you need a refill on your pain medication, please contact your pharmacy.  They will contact our office to request authorization.  Prescriptions will not be filled after 5pm or on week-ends. °3. You should follow a light diet the first few days after arrival home, such as soup and crackers, etc.  Resume your normal diet the day after surgery. °4. Most patients will experience some swelling and bruising on the chest and underarm.  Ice packs will help.  Swelling and bruising can take several days to resolve.  °5. It is common to experience some constipation if taking pain medication after surgery.  Increasing fluid intake and taking a stool softener (such as Colace) will usually help or prevent this problem from occurring.  A mild laxative (Milk of Magnesia or Miralax) should be taken according to package instructions if there are no bowel movements after 48 hours. °6. Unless discharge instructions indicate otherwise, leave your bandage dry and in place until your next appointment in 3-5 days.  You may take a limited sponge bath.  No tube baths or showers until the drains are removed.  You may have steri-strips (small skin tapes) in place directly over the incision.  These strips should be left on the skin for 7-10 days.  If your surgeon used skin glue on the incision, you may  shower in 24 hours.  The glue will flake off over the next 2-3 weeks.  Any sutures or staples will be removed at the office during your follow-up visit. °7. DRAINS:  If you have drains in place, it is important to keep a list of the amount of drainage produced each day in your drains.  Before leaving the hospital, you should be instructed on drain care.  Call our office if you have any questions about your drains. °8. ACTIVITIES:  You may resume regular (light) daily activities beginning the next day--such as daily self-care, walking, climbing stairs--gradually increasing activities as tolerated.  You may have sexual intercourse when it is comfortable.  Refrain from any heavy lifting or straining until approved by your doctor. °a. You may drive when you are no longer taking prescription pain medication, you can comfortably wear a seatbelt, and you can safely maneuver your car and apply brakes. °b. RETURN TO WORK:  __________________________________________________________ °9. You should see your doctor in the office for a follow-up appointment approximately 3-5 days after your surgery.  Your doctor’s nurse will typically make your follow-up appointment when she calls you with your pathology report.  Expect your pathology report 2-3 business days after your surgery.  You may call to check if you do not hear from us after three days.   °10. OTHER INSTRUCTIONS: ______________________________________________________________________________________________ ____________________________________________________________________________________________ °WHEN TO CALL YOUR DOCTOR: °1. Fever over 101.0 °2. Nausea and/or vomiting °3. Extreme swelling or bruising °4. Continued bleeding from incision. °5. Increased pain, redness, or drainage from the incision. °  The clinic staff is available to answer your questions during regular business hours.  Please don’t hesitate to call and ask to speak to one of the nurses for clinical  concerns.  If you have a medical emergency, go to the nearest emergency room or call 911.  A surgeon from [facility] is always on call at the hospital. °[address], [address], [place],   [address] ? [address] [address], [place],    [address] °[phone] ? [phone] ? FAX [phone] °Web site: www.[facility] ° ° °.Bulb Drain Home Care °A bulb drain consists of a thin rubber tube and a soft, round bulb that creates a gentle suction. The rubber tube is placed in the area where you had surgery. A bulb is attached to the end of the tube that is outside the body. The bulb drain removes excess fluid that normally builds up in a surgical wound after surgery. The color and amount of fluid will vary. Immediately after surgery, the fluid is bright red and is a little thicker than water. It may gradually change to a yellow or pink color and become more thin and water-like. When the amount decreases to about 1 or 2 tbsp in 24 hours, your health care provider will usually remove it. °DAILY CARE °· Keep the bulb flat (compressed) at all times, except while emptying it. The flatness creates suction. You can flatten the bulb by squeezing it firmly in the middle and then closing the cap. °· Keep sites where the tube enters the skin dry and covered with a bandage (dressing). °· Secure the tube 1-2 in (2.5-5.1 cm) below the insertion sites to keep it from pulling on your stitches. The tube is stitched in place and will not slip out. °· Secure the bulb as directed by your health care provider. °· For the first 3 days after surgery, there usually is more fluid in the bulb. Empty the bulb whenever it becomes half full because the bulb does not create enough suction if it is too full. The bulb could also overflow. Write down how much fluid you remove each time you empty your drain. Add up the amount removed in 24 hours. °· Empty the bulb at the same time every day once the amount of fluid decreases and you  only need to empty it once a day. Write down the amounts and the 24-hour totals to give to your health care provider. This helps your health care provider know when the tubes can be removed. °EMPTYING THE BULB DRAIN °Before emptying the bulb, get a measuring cup, a piece of paper and a pen, and wash your hands. °· Gently run your fingers down the tube (stripping) to empty any drainage from the tubing into the bulb. This may need to be done several times a day to clear the tubing of clots and tissue. °· Open the bulb cap to release suction, which causes it to inflate. Do not touch the inside of the cap. °· Gently run your fingers down the tube (stripping) to empty any drainage from the tubing into the bulb. °· Hold the cap out of the way, and pour fluid into the measuring cup.   °· Squeeze the bulb to provide suction.  °· Replace the cap.   °· Check the tape that holds the tube to your skin. If it is becoming loose, you can remove the loose piece of tape and apply a new one. Then, pin the bulb to your shirt.   °· Write down   down the amount of fluid you emptied out. Write down the date and each time you emptied your bulb drain. (If there are 2 bulbs, note the amount of drainage from each bulb and keep the totals separate. Your health care provider will want to know the total amounts for each drain and which tube is draining more.)   Flush the fluid down the toilet and wash your hands.   Call your health care provider once you have less than 2 tbsp of fluid collecting in the bulb drain every 24 hours. If there is drainage around the tube site, change dressings and keep the area dry. Cleanse around tube with sterile saline and place dry gauze around site. This gauze should be changed when it is soiled. If it stays clean and unsoiled, it should still be changed daily.  SEEK MEDICAL CARE IF:  Your drainage has a bad smell or is cloudy.   You have a fever.   Your drainage is increasing instead of decreasing.    Your tube fell out.   You have redness or swelling around the tube site.   You have drainage from a surgical wound.   Your bulb drain will not stay flat after you empty it.  MAKE SURE YOU:   Understand these instructions.  Will watch your condition.  Will get help right away if you are not doing well or get worse.   This information is not intended to replace advice given to you by your health care provider. Make sure you discuss any questions you have with your health care provider.   Document Released: 06/10/2000 Document Revised: 07/04/2014 Document Reviewed: 12/31/2014 Elsevier Interactive Patient Education [employer].

## 2016-03-16 NOTE — Progress Notes (Signed)
1 Day Post-Op  Subjective: DOING WELL NO COMPLAINTS  AMBULATING WITHOUT DIFFICULTY   Objective: Vital signs in last 24 hours: Temp:  [97 F (36.1 C)-98.1 F (36.7 C)] 97.8 F (36.6 C) (09/20 0447) Pulse Rate:  [57-73] 64 (09/20 0447) Resp:  [10-23] 18 (09/20 0447) BP: (108-173)/(57-88) 108/83 (09/20 0447) SpO2:  [97 %-100 %] 99 % (09/20 0447) Weight:  [85.1 kg (187 lb 9.8 oz)-100 kg (220 lb 7.4 oz)] 100 kg (220 lb 7.4 oz) (09/19 1738) Last BM Date: 03/15/16  Intake/Output from previous day: 09/19 0701 - 09/20 0700 In: 1732.5 [P.O.:360; I.V.:1372.5] Out: 941 [Urine:400; Emesis/NG output:130; Drains:361; Blood:50] Intake/Output this shift: No intake/output data recorded.  Incision/Wound:flaps viable  No hematoma  Bloody serous drainage noted in JP   Lab Results:   Recent Labs  03/15/16 1930 03/16/16 0542  WBC 6.7 4.4  HGB 10.8* 9.9*  HCT 33.0* 30.1*  PLT 108* 108*   BMET  Recent Labs  03/15/16 1930  CREATININE <0.30*   PT/INR No results for input(s): LABPROT, INR in the last 72 hours. ABG No results for input(s): PHART, HCO3 in the last 72 hours.  Invalid input(s): PCO2, PO2  Studies/Results: Nm Sentinel Node Inj-no Rpt (breast)  Result Date: 03/15/2016 CLINICAL DATA: left breast cancer Sulfur colloid was injected intradermally by the nuclear medicine technologist for breast cancer sentinel node localization.    Anti-infectives: Anti-infectives    Start     Dose/Rate Route Frequency Ordered Stop   03/15/16 2200  ceFAZolin (ANCEF) IVPB 2g/100 mL premix     2 g 200 mL/hr over 30 Minutes Intravenous Every 8 hours 03/15/16 1818 03/16/16 0046   03/15/16 1200  ceFAZolin (ANCEF) 3 g in dextrose 5 % 50 mL IVPB     3 g 130 mL/hr over 30 Minutes Intravenous To [facility] 03/15/16 0751 03/15/16 1229      Assessment/Plan: s/p Procedure(s): LEFT SIMPLE MASTECTOMY WITH SENTINEL LYMPH NODE MAPPING (Left) Discharge  LOS: 0 days    Maria Strickland  A. 03/16/2016

## 2016-03-16 NOTE — Anesthesia Postprocedure Evaluation (Signed)
Anesthesia Post Note  Patient: Maria Strickland  Procedure(s) Performed: Procedure(s) (LRB): LEFT SIMPLE MASTECTOMY WITH SENTINEL LYMPH NODE MAPPING (Left)  Patient location during evaluation: PACU Anesthesia Type: General and Regional Level of consciousness: awake and alert Pain management: pain level controlled Vital Signs Assessment: post-procedure vital signs reviewed and stable Respiratory status: spontaneous breathing, nonlabored ventilation, respiratory function stable and patient connected to nasal cannula oxygen Cardiovascular status: blood pressure returned to baseline and stable Postop Assessment: no signs of nausea or vomiting Anesthetic complications: no    Last Vitals:  Vitals:   03/16/16 0447 03/16/16 0951  BP: 108/83 121/68  Pulse: 64 78  Resp: 18 16  Temp: 36.6 C 36.4 C    Last Pain:  Vitals:   03/16/16 1002  TempSrc:   PainSc: 4                  Tiajuana Amass

## 2016-03-16 NOTE — Progress Notes (Signed)
D/C papers gone over with pt. Prescription given to pt. No questions/complaints. Instructed pt. To call doctor's office to make a follow-up appointment. Educated pt. On how to empty/record JP drain and gave her instructions sheet on JP drains. No questions/complaints. IV taken out. Pt. D/c'd successfully via w/c.

## 2016-03-28 ENCOUNTER — Other Ambulatory Visit: Payer: Self-pay | Admitting: Oncology

## 2016-04-03 ENCOUNTER — Other Ambulatory Visit: Payer: Self-pay | Admitting: Oncology

## 2016-04-04 ENCOUNTER — Other Ambulatory Visit (HOSPITAL_BASED_OUTPATIENT_CLINIC_OR_DEPARTMENT_OTHER): Payer: Medicare PPO

## 2016-04-04 ENCOUNTER — Encounter: Payer: Self-pay | Admitting: Oncology

## 2016-04-04 ENCOUNTER — Ambulatory Visit (HOSPITAL_BASED_OUTPATIENT_CLINIC_OR_DEPARTMENT_OTHER): Payer: Medicare PPO | Admitting: Oncology

## 2016-04-04 ENCOUNTER — Ambulatory Visit (HOSPITAL_BASED_OUTPATIENT_CLINIC_OR_DEPARTMENT_OTHER): Payer: Medicare PPO

## 2016-04-04 ENCOUNTER — Ambulatory Visit: Payer: Medicare PPO | Attending: Surgery | Admitting: Physical Therapy

## 2016-04-04 VITALS — BP 168/82 | HR 75 | Temp 98.6°F | Resp 18 | Ht 63.0 in | Wt 187.2 lb

## 2016-04-04 DIAGNOSIS — C7A1 Malignant poorly differentiated neuroendocrine tumors: Secondary | ICD-10-CM

## 2016-04-04 DIAGNOSIS — Z483 Aftercare following surgery for neoplasm: Secondary | ICD-10-CM | POA: Diagnosis present

## 2016-04-04 DIAGNOSIS — C801 Malignant (primary) neoplasm, unspecified: Secondary | ICD-10-CM

## 2016-04-04 DIAGNOSIS — Z95828 Presence of other vascular implants and grafts: Secondary | ICD-10-CM

## 2016-04-04 DIAGNOSIS — Z23 Encounter for immunization: Secondary | ICD-10-CM | POA: Diagnosis not present

## 2016-04-04 DIAGNOSIS — Z452 Encounter for adjustment and management of vascular access device: Secondary | ICD-10-CM

## 2016-04-04 DIAGNOSIS — R2232 Localized swelling, mass and lump, left upper limb: Secondary | ICD-10-CM | POA: Diagnosis present

## 2016-04-04 DIAGNOSIS — M25612 Stiffness of left shoulder, not elsewhere classified: Secondary | ICD-10-CM

## 2016-04-04 DIAGNOSIS — M79602 Pain in left arm: Secondary | ICD-10-CM | POA: Diagnosis present

## 2016-04-04 DIAGNOSIS — D701 Agranulocytosis secondary to cancer chemotherapy: Secondary | ICD-10-CM | POA: Diagnosis not present

## 2016-04-04 DIAGNOSIS — D6959 Other secondary thrombocytopenia: Secondary | ICD-10-CM | POA: Diagnosis not present

## 2016-04-04 DIAGNOSIS — Z9012 Acquired absence of left breast and nipple: Secondary | ICD-10-CM

## 2016-04-04 DIAGNOSIS — I10 Essential (primary) hypertension: Secondary | ICD-10-CM

## 2016-04-04 DIAGNOSIS — D649 Anemia, unspecified: Secondary | ICD-10-CM | POA: Diagnosis not present

## 2016-04-04 DIAGNOSIS — Z79899 Other long term (current) drug therapy: Secondary | ICD-10-CM

## 2016-04-04 LAB — CBC WITH DIFFERENTIAL/PLATELET
BASO%: 0.6 % (ref 0.0–2.0)
Basophils Absolute: 0 10*3/uL (ref 0.0–0.1)
EOS%: 1.4 % (ref 0.0–7.0)
Eosinophils Absolute: 0.1 10*3/uL (ref 0.0–0.5)
HCT: 32 % — ABNORMAL LOW (ref 34.8–46.6)
HEMOGLOBIN: 10.8 g/dL — AB (ref 11.6–15.9)
LYMPH#: 1 10*3/uL (ref 0.9–3.3)
LYMPH%: 23.6 % (ref 14.0–49.7)
MCH: 31.2 pg (ref 25.1–34.0)
MCHC: 33.7 g/dL (ref 31.5–36.0)
MCV: 92.6 fL (ref 79.5–101.0)
MONO#: 0.3 10*3/uL (ref 0.1–0.9)
MONO%: 6.7 % (ref 0.0–14.0)
NEUT%: 67.7 % (ref 38.4–76.8)
NEUTROS ABS: 2.7 10*3/uL (ref 1.5–6.5)
Platelets: 125 10*3/uL — ABNORMAL LOW (ref 145–400)
RBC: 3.46 10*6/uL — ABNORMAL LOW (ref 3.70–5.45)
RDW: 14.5 % (ref 11.2–14.5)
WBC: 4.1 10*3/uL (ref 3.9–10.3)

## 2016-04-04 LAB — COMPREHENSIVE METABOLIC PANEL
ALBUMIN: 3.3 g/dL — AB (ref 3.5–5.0)
ALK PHOS: 74 U/L (ref 40–150)
AST: 9 U/L (ref 5–34)
Anion Gap: 8 mEq/L (ref 3–11)
BUN: 14.8 mg/dL (ref 7.0–26.0)
CO2: 21 mEq/L — ABNORMAL LOW (ref 22–29)
CREATININE: 1.1 mg/dL (ref 0.6–1.1)
Calcium: 10.6 mg/dL — ABNORMAL HIGH (ref 8.4–10.4)
Chloride: 117 mEq/L — ABNORMAL HIGH (ref 98–109)
EGFR: 60 mL/min/{1.73_m2} — ABNORMAL LOW (ref >90)
GLUCOSE: 101 mg/dL (ref 70–140)
POTASSIUM: 4.1 meq/L (ref 3.5–5.1)
SODIUM: 145 meq/L (ref 136–145)
TOTAL PROTEIN: 6.7 g/dL (ref 6.4–8.3)
Total Bilirubin: 1.06 mg/dL (ref 0.20–1.20)

## 2016-04-04 MED ORDER — SODIUM CHLORIDE 0.9 % IJ SOLN
10.0000 mL | INTRAMUSCULAR | Status: DC | PRN
  Administered 2016-04-04: 10 mL via INTRAVENOUS
  Filled 2016-04-04: qty 10

## 2016-04-04 MED ORDER — INFLUENZA VAC SPLIT QUAD 0.5 ML IM SUSY
0.5000 mL | PREFILLED_SYRINGE | Freq: Once | INTRAMUSCULAR | Status: AC
  Administered 2016-04-04: 0.5 mL via INTRAMUSCULAR
  Filled 2016-04-04: qty 0.5

## 2016-04-04 MED ORDER — HEPARIN SOD (PORK) LOCK FLUSH 100 UNIT/ML IV SOLN
500.0000 [IU] | Freq: Once | INTRAVENOUS | Status: AC | PRN
  Administered 2016-04-04: 500 [IU] via INTRAVENOUS
  Filled 2016-04-04: qty 5

## 2016-04-04 MED ORDER — LORAZEPAM 1 MG PO TABS: 1.0000 mg | ORAL_TABLET | Freq: Three times a day (TID) | ORAL | 0 refills | Status: DC | PRN

## 2016-04-04 NOTE — Therapy (Addendum)
[place], [place], [address] Phone: [phone]   Fax:  [phone]  Physical Therapy Evaluation  Patient Details  Name: Maria Strickland MRN: [id number] Date of Birth: 1946-02-23 Referring Provider: Erroll Luna, MD  Encounter Date: 04/04/2016      PT End of Session - 04/04/16 1042    Visit Number 1   Number of Visits 9   Date for PT Re-Evaluation 05/06/16   PT Start Time 0816   PT Stop Time 0857   PT Time Calculation (min) 41 min   Activity Tolerance Patient tolerated treatment well   Behavior During Therapy [facility] for tasks assessed/performed      Past Medical History:  Diagnosis Date  . Anemia due to chemotherapy   . Anxiety   . Cancer [facility])    left breast cancer  . Depression   . GERD (gastroesophageal reflux disease)   . Hypertension     Past Surgical History:  Procedure Laterality Date  . BREAST SURGERY     biospy  . MASTECTOMY W/ SENTINEL NODE BIOPSY Left 03/15/2016   Procedure: LEFT SIMPLE MASTECTOMY WITH SENTINEL LYMPH NODE MAPPING;  Surgeon: Erroll Luna, MD;  Location: [facility];  Service: General;  Laterality: Left;  . port-a-cath insertion  09/2015  . port-a-cath insertion  12/2015  . PORT-A-CATH REMOVAL     July 2017    There were no vitals filed for this visit.       Subjective Assessment - 04/04/16 0822    Subjective "I'm not swelling now, but I was swollen in my left arm and it's still a little puffy in the underarm. It's tender in some places and numb in some places." Patient states she is back to her regular schedule.   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 Patient was told "everything was clean," meaning the lymph nodes were negative. Patient is seeing her MD today and will  find out if she has to continue chemotherapy or do radiation.    Patient Stated Goals to stop the pain and swelling so I can go back to work soon   Currently in Pain? Yes   Pain Score 0-No pain  it comes and goes   Pain Location Axilla   Pain Orientation Left   Pain Descriptors / Indicators Aching   Pain Type Surgical pain   Pain Radiating Towards down left UE   Aggravating Factors  laying on left side   Pain Relieving Factors medication            [facility] PT Assessment - 04/04/16 0001      Assessment   Medical Diagnosis left breast cancer   Referring Provider Erroll Luna, MD   Onset Date/Surgical Date 03/15/16   Hand Dominance Right   Next MD Visit today     Precautions   Precaution Comments cancer precautions     Restrictions   Weight Bearing Restrictions No     Balance Screen   Has the patient fallen in the past 6 months Yes   How many times? 1  was walking outside in bedroom shoes and lost balance   Has the patient had a decrease in activity level because of a fear of falling?  No   Is the patient reluctant to leave their home because of a fear of falling?  No  Home Environment   Living Environment Private residence   Living Arrangements Spouse/significant other   Type of [place] to enter   Entrance Stairs-Number of Steps 6   Entrance Stairs-Rails Can reach both   [place] Two level     Prior Function   Level of Independence Independent   Vocation Part time employment  works at [employer] no regular exercise currently; prior to surgery she was walking between 30 minutes and an hour every morning, as well as other exercises     Cognition   Overall Cognitive Status Within Functional Limits for tasks assessed     Observation/Other Assessments   Other Surveys  --  LLIS: 23% impairment     ROM / Strength   AROM / PROM / Strength AROM     AROM   AROM Assessment Site Shoulder   Right/Left Shoulder  Right;Left   Right Shoulder Flexion 135 Degrees   Right Shoulder ABduction 131 Degrees   Left Shoulder Flexion 109 Degrees   Left Shoulder ABduction 92 Degrees           LYMPHEDEMA/ONCOLOGY QUESTIONNAIRE - 04/04/16 0829      Lymphedema Assessments   Lymphedema Assessments Upper extremities     Right Upper Extremity Lymphedema   15 cm Proximal to Olecranon Process 35.5 cm   10 cm Proximal to Olecranon Process 34.8 cm   Olecranon Process 25.2 cm   15 cm Proximal to Ulnar Styloid Process 23.5 cm   10 cm Proximal to Ulnar Styloid Process 21 cm   Just Proximal to Ulnar Styloid Process 16 cm   Across Hand at [employer] 20.2 cm   At [place] of 2nd Digit 6 cm     Left Upper Extremity Lymphedema   15 cm Proximal to Olecranon Process 35.2 cm   10 cm Proximal to Olecranon Process 34 cm   Olecranon Process 25.6 cm   15 cm Proximal to Ulnar Styloid Process 24 cm   10 cm Proximal to Ulnar Styloid Process 20.7 cm   Just Proximal to Ulnar Styloid Process 16.7 cm   Across Hand at [employer] 20.7 cm   At [place] of 2nd Digit 6 cm                        PT Education - 04/04/16 1037    Education provided Yes   Education Details instructed patient on supine cane exercises and doorway shoulder external rotation stretch; provided patient with After Breast Cancer class packet and educated patient on compression garments (where to obtain them)   Person(s) Educated Patient;Spouse   Methods Explanation;Demonstration;Handout   Comprehension Verbalized understanding;Returned demonstration                Long Term Clinic Goals - 04/04/16 1056      [place] Term Goal  #1   Title Patient will achieve 130 degrees of left shoulder flexion and abduction AROM to improved performance of ADLs.   Baseline flexion 109 degrees, abduction 92 degrees at eval    Time 4   Period Weeks   Status New     [facility] Long Term Goal  #2   Title Patient will be knowledgeable about lymphedema  risk reduction practices.   Time 4   Period Weeks   Status New     [facility] Long Term Goal  #3   Title Patient will be independent in  HEP for shoulder stretching and strengthening.   Time 4   Period Weeks   Status New            Plan - 04/04/16 1042    Clinical Impression Statement Patient is a 70 year old female s/p left mastectomy with sentinel lymph node biopsy on 03/15/16. She reports she had some swelling in her left arm, but that has gone down some. She demonstrates limited left shoulder AROM and reports some pain that radiates down her left arm when she is moving it. She does not know if she would be able to continue physical therapy her as she is having to drive from [place]. Therapist instructed her in exercises for stretching her left shoulder and provided her with information regarding compression garments. Therapist also provided patient with information on the clinic in [place], which is closer to her house. Patient is supposed to call when she decides what she wants to do regarding therapy.   Rehab Potential Good   Clinical Impairments Affecting Rehab Potential recent mastectomy (03/15/16), previous chemotherapy   PT Treatment/Interventions ADLs/Self Care Home Management;Electrical Stimulation;Therapeutic exercise;Therapeutic activities;DME Instruction;Patient/family education;Manual techniques;Manual lymph drainage;Scar mobilization;Passive range of motion;Taping   PT Next Visit Plan if patient returns, begin left shoulder P/A/ROM exercises, possible left neural stretch   Consulted and Agree with Plan of Care Patient      Patient will benefit from skilled therapeutic intervention in order to improve the following deficits and impairments:  Decreased range of motion, Increased edema, Impaired UE functional use, Postural dysfunction, Pain  Visit Diagnosis: Stiffness of left shoulder, not elsewhere classified  Aftercare following surgery for neoplasm  Pain in left  arm  Localized swelling, mass and lump, left upper limb     Problem List Patient Active Problem List   Diagnosis Date Noted  . Breast cancer of upper-outer quadrant of left female breast ([facility]) 03/15/2016  . Breast cancer, female, left 02/24/2016  . Gastritis 01/31/2016  . Essential hypertension 01/09/2016  . 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 Anemia due to antineoplastic chemotherapy 11/30/2015  . Chemotherapy induced thrombocytopenia 11/30/2015  . Chemotherapy induced neutropenia ([place]) 11/30/2015  . Poor dentition 11/30/2015  . Hypertension 11/30/2015  . Portacath in place 11/30/2015    [facility] 04/04/2016, [place], [place], [address] Phone: [phone]   Fax:  [phone]  Name: Maria Strickland MRN: [id number] Date of Birth: 06-Aug-1945  Saverio Danker, SPT  This entire session was guided, instructed, and directly supervised by Serafina Royals, PT.  Read, reviewed, edited and agree with student's findings and recommendations.   Serafina Royals, PT 04/04/16 11:33 AM  PHYSICAL THERAPY DISCHARGE SUMMARY  Visits from Start of Care: 1  Current functional level related to goals / functional outcomes: Unknown:  The patient did not return for the course of treatment planned.  She did call us to say she would not be following up.   Remaining deficits: unknown   Education / Equipment: Beginning HEP for shoulder ROM. Plan: Patient agrees to discharge.  Patient goals were not met. Patient is being discharged due to the patient's request.  ?????    Serafina Royals, PT 09/07/17 1:49 PM

## 2016-04-04 NOTE — Patient Instructions (Addendum)
Flexors Stick Stretch, Supine    Lie on back, stick in both hands above chest. Extend both arms over head as far as possible. Hold _5__ seconds. Repeat _5__ times per session (and later progress to 10 times). Do _2-3__ sessions per day.  Alternatively, you could clasp your hands together and raise them up overhead just like above.    Copyright  VHI. All rights reserved.  Inferior Capsule Stick Stretch I    Lie on your back (or stand or sit), dowel in palm of arm to be stretched. Other arm, holding dowel in front of body, pushes outward and upward until arm being stretched is as high to the side as possible. Hold _5__ seconds. Repeat _5-10__ times per session. Do _2-3__ sessions per day.  Copyright  VHI. All rights reserved.

## 2016-04-04 NOTE — Progress Notes (Signed)
OFFICE PROGRESS NOTE   April 04, 2016   Physicians: Anders Grant Matthew Folks [facility] [place]  INTERVAL HISTORY:  Patient is seen, together with husband, now having had left mastectomy with sentinel axillary lymph node evaluation by Dr Brantley Stage on 03-15-16. 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  She has not been presented at Multidisciplinary Breast Conference since mastectomy.  Patient tells me that she did well with surgery and is recovering well. Drain was removed after a week. She saw Dr Brantley Stage last week and will see him again on 04-15-16. She has had slight drainage from the surgical wound, no significant pain. She had swelling LUE after surgery, now resolved, and limited ROM at right shoulder for which she had initial PT evaluation today. She has had no fever, no respiratory symptoms, no significant pain, good appetite, bowels fine, no bladder symptoms.  Pathology [phone]) from 03-15-16 left mastectomy poorly differentiated neuroendocrine carcinoma grade 3, 4.5 cm, margins and skin/ nipple/ skeletal muscle not involved with closest margin posterior at 3 mm, 3 sentinel nodes negative, discrepancy in whether or not LVSI present in that report and I will ask for clarification. She would prefer PT in [place], [place] or [place] due to travel distance to [place]. She may not need lymphedema PT at present if that is not available closer to home.    PAC replaced by IR on 12-30-15, flushed today.  ONCOLOGIC HISTORY Patient had not had regular medical care in a number of years when she noticed 2 masses in central left breast in Dec 2016. She was seen by Dr [name] in [place], with bilateral mammograms done 09-02-15 at [facility]. The mammograms, with no priors for comparison, showed in the left breast a dense irregular mass at least 3 cm in  greatest dimension, with pleomorphic calcifications extending 5 cm inferomedially in left breast and with associated nipple inversion and retraction; also in the left breast was a 10 mm irregular mass at 9-10 o'clock. In the right breast an irregular mass centrally slightly medial to nipple 1.3 cm diameter. [place] on left breast showed the mass at 12 o'clock measured 2.8 x 2.4 x 2.5 cm with internal vascularity, and additional hypoechoic mass at 9 o'clock 0.8 x 0.4 x 0.6 cm. In right breast by [place] no suspicious solid or cystic mass identified. 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 Morphologically the neuroendocrine carcinoma is reported to show small to intermediate sized cells with high mitotic count and prominent necrosis, strongly positive for pancytokeratin and negative for chromogranin A, positive for synaptophysin, strongly positive for TTF-1, ER 1%, PR 105, Ki-67 of 96%. She subsequently had biopsy of the right breast lesion ([id number]) at [facility], no malignancy.  Staging studies done as follows:  Bone scan [place] 10-05-15 with bilateral anterior lower chest activity not clearly malignant, symmetrical renal activity, osteoarthritis left knee. CT CAP at [facility] 09-30-15 : Chest no hilar or mediastinal adenopathy, thyroid normal, no pericardial effusion, no pulmonary lesions or pleural effusion, no lytic or blastic skeletal lesions. Abdomen numerous hepatic cysts without evidence of hepatic mets, adrenals mildly nodular, numerous renal cysts without hydronephrosis, spleen without focal lesions, pancreas normal, bowels unremarkable  other than diverticulosis descending and sigmoid. No adenopathy. Pelvis no adenopathy, uterine fibroids, endometrium thickened at 23 mm , no  ascites, bladder normal . PET Elvina Sidle 2-92-44 no hypermetabolic hilar or Mediastinal nodes, no pulmonary uptake, left breast mass with SUV 22.89 measuring 3.3 cm without uptake in axillary, retropectoral or supraclavicular nodes. An area of uptake anterior spleen with SUV max 5.63 corresponding to low density structure not present on 09-30-15, no liver concerns, no other uptake abdomen or pelvis. No mention of endometrial lining. Diffuse heterogeneous uptake thru axial and appendicular skeleton which would be consistent either with diffuse marrow/ bone involvement or anemia. Dr Jacquiline Doe discussed with IR, decision made not to attempt biopsy of spleen.  Dr Jacquiline Doe discussed case with breast oncologist Dr Myra Gianotti Muss and Dr L.Curey. Recommendation was for neoadjuvant carbo VP16 x 4 cycles with consideration of additional anthracyclines, then consideration of mastectomy with axillary node evaluation and further management depending on surgical findings.  Note patient refused MRI brain during initial staging.  Patient received cycle 1 carbo VP16 ~ May 1-2-3 in [place], those chemo records not included in outside information. She received neulasta, however developed profound neutropenia without febrile complications, thrombocytopenia and anemia (hgb 7.9) for which she was transfused 2 units PRBCs. Cycle 2 was given 5-22,23,24, reportedly dose reduced from cycle 1. Cycle 2 carbo dose total was 445.5 mg and VP16 118 mg, with decadron 12 mg and Aloxi 0.25 mg. VP16 doses on 5-23 and 5-24 were each 118 mg total , with decadron 12 mg as only antiemetic. Family tells me that she had neulasta also after cycle 2, that information not in these outside records.  Labs 11-16-15 WBC 7.2, ANC 4.8, Hgb 10.2, plt 202k Chemistries date not clear to me (listed as "2 weeks ago") creat 1.01, BUN 15, calcium 9.9, Tprot 6.4, alb 3.65, AP 90, AST 18, Tbili 0.7, ALT 15, Na 145, k 4.3, Cl 111, glu 94  PerDr Darovsky, the left breast  mass hadimproved only slightly with chemo given thru cycle 2. Cycle 3 carbo VP16 given at [facility] 6-19 thru 12-16-15, with neulasta continued. Due to concerns that the breast mass was not responding as expected, she had restaging CT CAP on 01-01-16 ,which showed breast mass 2.8 x 2.6 cm as compared with 3.3 x 2.6 cm on imaging 09-2015, stable area in spleen. Outside pathology was reviewed by [facility] pathologist, agreed with previous diagnosis. Cycle 4 Carbo VP16 was given 7-17 thru 01-13-16.Follow up PET after cycle 4, on 01-25-16 showed uptake in left breast mass SUV 17.4 compared with 22.9 previously, no hypermetabolic axillary or mediastinal nodes, stable uptake 5.5 in spleen with calcifications there identified on noncontrast images suggesting benign, diffuse marrow activity consistent with anemia and gCSF, probable gastritis. Cycle 5 was given days 1 and 2 only due to concern for cytopenias and timing of upcoming surgery. She had left mastectomy with sentinel node evaluation by Dr Brantley Stage on 03-15-16, pathology [phone]) from 03-15-16 left mastectomy poorly differentiated neuroendocrine carcinoma grade 3, 4.5 cm, margins and skin/ nipple/ skeletal muscle not involved with closest margin posterior at 3 mm, 3 sentinel nodes negative, discrepancy in whether or not LVSI present in that report and I have asked pathology for clarification.  Objective:  Vital signs in last 24 hours:  BP (!) 168/82 (BP Location: Right Arm, Patient Position: Sitting)   Pulse 75   Temp 98.6 F (37 C) (Oral)   Resp 18   Ht 5' 3"  (1.6 m)  Wt 187 lb 3.2 oz (84.9 kg)   SpO2 100%   BMI 33.16 kg/m  Weight down 2 lbs. Looks comfortable, very pleasant as always. Respirations not labored. Alert, oriented and appropriate. Ambulatory without assistance.    HEENT:PERRL, sclerae not icteric. Oral mucosa moist without lesions, posterior pharynx clear.  Neck supple. No JVD.  Lymphatics:no cervical,supraclavicular, axillary or  inguinal adenopathy Resp: clear to auscultation bilaterally and normal percussion bilaterally Cardio: regular rate and rhythm. No gallop. GI: soft, nontender, not distended, no mass or organomegaly. Normally active bowel sounds. Surgical incision not remarkable. Musculoskeletal/ Extremities: without pitting edema, cords, tenderness Neuro: no peripheral neuropathy. Otherwise nonfocal. PSYCH appropriate mood and affect Skin without rash, ecchymosis, petechiae Breasts: right without dominant mass, skin or nipple findings. Left mastectomy scar with central area of skin discoloration ~ 4 x 5 cm and some probable minimal seroma. Tightness anterior axillary fold with axilla otherwise not remarkable. No swelling obvious LUE visually.  Portacath-without erythema or tenderness  Lab Results:  Results for orders placed or performed in visit on 04/04/16  CBC with Differential  Result Value Ref Range   WBC 4.1 3.9 - 10.3 10e3/uL   NEUT# 2.7 1.5 - 6.5 10e3/uL   HGB 10.8 (L) 11.6 - 15.9 g/dL   HCT 32.0 (L) 34.8 - 46.6 %   Platelets 125 (L) 145 - 400 10e3/uL   MCV 92.6 79.5 - 101.0 fL   MCH 31.2 25.1 - 34.0 pg   MCHC 33.7 31.5 - 36.0 g/dL   RBC 3.46 (L) 3.70 - 5.45 10e6/uL   RDW 14.5 11.2 - 14.5 %   lymph# 1.0 0.9 - 3.3 10e3/uL   MONO# 0.3 0.1 - 0.9 10e3/uL   Eosinophils Absolute 0.1 0.0 - 0.5 10e3/uL   Basophils Absolute 0.0 0.0 - 0.1 10e3/uL   NEUT% 67.7 38.4 - 76.8 %   LYMPH% 23.6 14.0 - 49.7 %   MONO% 6.7 0.0 - 14.0 %   EOS% 1.4 0.0 - 7.0 %   BASO% 0.6 0.0 - 2.0 %  Comprehensive metabolic panel  Result Value Ref Range   Sodium 145 136 - 145 mEq/L   Potassium 4.1 3.5 - 5.1 mEq/L   Chloride 117 (H) 98 - 109 mEq/L   CO2 21 (L) 22 - 29 mEq/L   Glucose 101 70 - 140 mg/dl   BUN 14.8 7.0 - 26.0 mg/dL   Creatinine 1.1 0.6 - 1.1 mg/dL   Total Bilirubin 1.06 0.20 - 1.20 mg/dL   Alkaline Phosphatase 74 40 - 150 U/L   AST 9 5 - 34 U/L   ALT <9 0 - 55 U/L   Total Protein 6.7 6.4 - 8.3 g/dL    Albumin 3.3 (L) 3.5 - 5.0 g/dL   Calcium 10.6 (H) 8.4 - 10.4 mg/dL   Anion Gap 8 3 - 11 mEq/L   EGFR 60 (L) >90 ml/min/1.73 m2     Studies/Results: Peckinpaugh, Marianny Collected: 03/15/2016 Client: [facility] Accession: [id number] Received: 03/15/2016 Erroll Luna, MD REPORT OF SURGICAL PATHOLOGY FINAL DIAGNOSIS Diagnosis 1. Breast, simple mastectomy, Left POORLY DIFFERENTIATED NEUROENDOCRINE CARCINOMA, GRADE 3, SPANNING 4.5 CM CARCINOMA IN SITU IS PRESENT LYMPHOVASCULAR INVASION IS IDENTIFIED ALL MARGINS OF RESECTION ARE NEGATIVE FOR CARCINOMA 2. Lymph node, sentinel, biopsy, Left Axilla ONE BENIGN LYMPH NODE (0/1) 3. Lymph node, sentinel, biopsy ONE BENIGN LYMPH NODE (0/1) 4. Lymph node, sentinel, biopsy ONE BENIGN LYMPH NODE (0/1) Microscopic Comment 1. CARCINOMA OF THE BREAST, STATUS POST NEOADJUVANT THERAPY Procedure: Simple mastectomy  Specimen Laterality: Left Histologic Type: high grade carcinoma with neuroendocrine feature Histologic Grade (Nottingham Histologic Score) Glandular (Acinar)/Tubular Differentiation: 3 Nuclear Pleomorphism: 3 Mitotic Rate: 3 Overall Grade: 3 Tumor Size: 45 mm Ductal Carcinoma In Situ (DCIS): Present Nuclear Grade: 3 Extensive intraductal component: minimal Tumor Extension: Skin: Negative Nipple: Identified Skeletal muscle: Negative Lymphovascular Invasion: Negative Residual Cancer Burden (RCB): 2.571 Primary Tumor Bed Primary tumor bed: 45 mm x 19m Overall cancer cellularity: 90% Percentage of cancer that is in situ: 1% Lymph nodes 0# of positive lymph nodes: Diameter of largest metastasis: NA Residual Cancer Burden ([email or web address]): Residual Cancer Burden Class: RCB-II Margins: Invasive carcinoma: Distance from closest margin: 3 mm from posterior margin Margin: NA, unifocal / multifocal / extensive DCIS: Distance from closest margin: 3 mm from posterior margin Margin: _NA_, unifocal /  multifocal / extensive Regional Lymph nodes: Total # lymph nodes examined: 3 # Sentinel lymph nodes examined: 3 # Lymph nodes with macrometastasis (>2.0 mm) NA # Lymph nodes with isolated tumor cells (<0.2 mm): NA # Lymph nodes with micrometastasis (> 0.2 mm and < 2.0 mm): NA Extranodal extension: NA Pathologic Stage Classification (pTNM, AJCC 7th Edition) TNM Descriptors: pTNM: ypT2 ypN0  Medications: I have reviewed the patient's current medications. Flu vaccine today.   DISCUSSION Interval history reviewed, including pathology information as above. General recommendations for extrapulmonary neuroendocrine tumors are to consider treatment as for the primary site, including adjuvant chemo and possibly radiation. Patient feels that she would like to have additional chemo "as precaution". With amount of disease remaining in breast after 5 cycles of carbo VP16, I feel regimen should change. She agrees to echocardiogram, which we will request on same day as Dr Cornett's next visit 04-15-16 (due to travel from [place]); if EF ok would use CAV x at least 2 cycles. I prefer to let the mastectomy site heal further prior to starting chemo, so timing of echo then my follow up as above. Consider local radiation after chemo if no distant disease then, tho [facility]   NOTE patient is to be presented at Multidisciplinary Breast Conference on 04-06-16, recommendations from that conference also to be considered.  With neuroendocrine features, I discussed case with my partner in pulmonary medical oncology, who would consider either close observation now post resection, or CDDP irinotecan, or CAV.   [facility] request clarification on lymphovascular space invasion in above path report.  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 Some limited response to first 2 cycles of carbo VP16 per Dr  D[facility]quiline Doe [facility]. Clinically and by CT only partial response of the breast mass. PET after cycle 4 still suggests limited disease, tho also still significant activity in breast.  Cycle 5 given x 2 days 8-14 and 8-15 due to timing of mastectomy. Mastectomy with sentinel nodes 03-15-16, still 4.5 cm grade 3 neuroendocrine tumor, clear margins, negative sentinel nodes, ? LVSI. I am inclined to use another regimen as adjuvant chemo now, ? Radiation. Review in conference, echocardiogram.  2. Chemo neutropenia with carbo VP, despite neulasta cycle 1, chemo thrombocytopenia and anemia requiring PRBCs also cycle 1. Cycle 2 was dose reduced, neulasta continued.  Anemia: iron WNL 11-2015, B12 fine.  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  5.family history of breast cancer in daughter, pancreatic in father, "bone cancer" in mother and prostate  cancer in brother 6.dental and gum disease: biotene 7.osteoarthritis left knee 8.declined information re advance directives 9..hypertension: untreated at presentation to gyn Dr Evie Lacks. Continue tenormin. Patient aware that she should establish with a PCP. 10.gastritis likely by PET. Symptoms resolved with protonix 11.PAC replaced by IR 12-30-15 (initial central line not in correct position) 12. Flu vaccine 04-04-16  All questions answered and patient/ husband are in agreement with recommendations and plans above. I will see her back in 2-3 weeks, after echocardiogram. Time spent 30 min including >50% counseling and coordination of care.  Cc Dr Brantley Stage   Evlyn Clines, MD   04/04/2016, 3:09 PM

## 2016-04-05 ENCOUNTER — Telehealth: Payer: Self-pay

## 2016-04-05 DIAGNOSIS — I10 Essential (primary) hypertension: Secondary | ICD-10-CM

## 2016-04-05 MED ORDER — ATENOLOL 50 MG PO TABS: 50.0000 mg | ORAL_TABLET | Freq: Every day | ORAL | 1 refills | Status: DC

## 2016-04-05 NOTE — Telephone Encounter (Signed)
Confirmed with Maria Strickland that she does not have a PCP.  She said that her sisiter said that one is coming to her area. Suggested that she look into getting a PCP. Dr. Marko Plume will fill the blood pressure medication with a refill.  Need to have PCP refill from there. Pt verbalized understanding  And appreciative of refill.

## 2016-04-05 NOTE — Telephone Encounter (Signed)
-----   Message from [name], MD sent at 04/05/2016  3:20 PM EDT ----- I do not think she has PCP for the BP med - Dr Evie Lacks is gyn I believe.  Can refill if needed from our office for now.  thanks

## 2016-04-06 ENCOUNTER — Encounter: Payer: Self-pay | Admitting: Oncology

## 2016-04-06 NOTE — Progress Notes (Signed)
Medical Oncology  Case presented at Multidisciplinary Breast Conference on 04-06-16.  Pathology small cell features. Did not address the LVSI question. Margins clear. Nodes negative.  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 Could consider radiation depending on situation after additional chemo.  Could do heart sparing radiation in [place], not in [place]; status of radiation oncology satellite in [place] ongoing not decided.  Godfrey Pick, MD

## 2016-04-15 ENCOUNTER — Ambulatory Visit (HOSPITAL_COMMUNITY)
Admission: RE | Admit: 2016-04-15 | Discharge: 2016-04-15 | Disposition: A | Discharge status: Home / Self Care | Payer: Medicare PPO | Source: Ambulatory Visit | Attending: Oncology | Admitting: Oncology

## 2016-04-15 DIAGNOSIS — C801 Malignant (primary) neoplasm, unspecified: Secondary | ICD-10-CM | POA: Diagnosis not present

## 2016-04-15 DIAGNOSIS — Z79899 Other long term (current) drug therapy: Secondary | ICD-10-CM | POA: Insufficient documentation

## 2016-04-15 LAB — ECHOCARDIOGRAM COMPLETE
CHL CUP STROKE VOLUME: 48 mL
E/e' ratio: 16.72
EWDT: 218 ms
FS: 31 % (ref 28–44)
IV/PV OW: 0.87
LA ID, A-P, ES: 38 mm
LA diam end sys: 38 mm
LA vol index: 31.2 mL/m2
LADIAMINDEX: 2.02 cm/m2
LAVOL: 58.6 mL
LAVOLA4C: 49.8 mL
LV E/e' medial: 16.72
LV PW d: 11.8 mm — AB (ref 0.6–1.1)
LV TDI E'LATERAL: 4.03
LV e' LATERAL: 4.03 cm/s
LVDIAVOL: 84 mL (ref 46–106)
LVDIAVOLIN: 45 mL/m2
LVEEAVG: 16.72
LVOT VTI: 20.1 cm
LVOT area: 2.84 cm2
LVOT peak vel: 86.5 cm/s
LVOTD: 19 mm
LVOTSV: 57 mL
LVSYSVOL: 37 mL (ref 14–42)
LVSYSVOLIN: 19 mL/m2
MV Dec: 218
MV pk E vel: 67.4 m/s
MVPKAVEL: 109 m/s
RV LATERAL S' VELOCITY: 13.9 cm/s
Simpson's disk: 57
TDI e' medial: 3.59

## 2016-04-15 MED ORDER — PERFLUTREN LIPID MICROSPHERE
INTRAVENOUS | Status: AC
  Filled 2016-04-15: qty 10

## 2016-04-15 MED ORDER — PERFLUTREN LIPID MICROSPHERE
1.0000 mL | INTRAVENOUS | Status: AC | PRN
  Administered 2016-04-15: 2 mL via INTRAVENOUS

## 2016-04-15 NOTE — Progress Notes (Signed)
  Echocardiogram 2D Echocardiogram with Definity has been performed.  Maria Strickland 04/15/2016, 1:08 PM

## 2016-04-17 ENCOUNTER — Other Ambulatory Visit: Payer: Self-pay | Admitting: Oncology

## 2016-04-18 ENCOUNTER — Ambulatory Visit (HOSPITAL_BASED_OUTPATIENT_CLINIC_OR_DEPARTMENT_OTHER): Payer: Medicare PPO

## 2016-04-18 ENCOUNTER — Ambulatory Visit (HOSPITAL_BASED_OUTPATIENT_CLINIC_OR_DEPARTMENT_OTHER): Payer: Medicare PPO | Admitting: Oncology

## 2016-04-18 ENCOUNTER — Other Ambulatory Visit (HOSPITAL_BASED_OUTPATIENT_CLINIC_OR_DEPARTMENT_OTHER): Payer: Medicare PPO

## 2016-04-18 VITALS — BP 189/84 | HR 78 | Temp 98.1°F | Resp 18 | Ht 63.0 in | Wt 189.4 lb

## 2016-04-18 DIAGNOSIS — Z95828 Presence of other vascular implants and grafts: Secondary | ICD-10-CM

## 2016-04-18 DIAGNOSIS — C801 Malignant (primary) neoplasm, unspecified: Secondary | ICD-10-CM

## 2016-04-18 DIAGNOSIS — D6959 Other secondary thrombocytopenia: Secondary | ICD-10-CM

## 2016-04-18 DIAGNOSIS — I10 Essential (primary) hypertension: Secondary | ICD-10-CM

## 2016-04-18 DIAGNOSIS — C7A8 Other malignant neuroendocrine tumors: Secondary | ICD-10-CM | POA: Diagnosis not present

## 2016-04-18 DIAGNOSIS — M1712 Unilateral primary osteoarthritis, left knee: Secondary | ICD-10-CM | POA: Diagnosis not present

## 2016-04-18 DIAGNOSIS — C7A1 Malignant poorly differentiated neuroendocrine tumors: Secondary | ICD-10-CM

## 2016-04-18 DIAGNOSIS — Z452 Encounter for adjustment and management of vascular access device: Secondary | ICD-10-CM

## 2016-04-18 DIAGNOSIS — D701 Agranulocytosis secondary to cancer chemotherapy: Secondary | ICD-10-CM

## 2016-04-18 DIAGNOSIS — C50012 Malignant neoplasm of nipple and areola, left female breast: Secondary | ICD-10-CM

## 2016-04-18 DIAGNOSIS — Z9012 Acquired absence of left breast and nipple: Secondary | ICD-10-CM

## 2016-04-18 LAB — CBC WITH DIFFERENTIAL/PLATELET
BASO%: 0.6 % (ref 0.0–2.0)
Basophils Absolute: 0 10*3/uL (ref 0.0–0.1)
EOS%: 0.6 % (ref 0.0–7.0)
Eosinophils Absolute: 0 10*3/uL (ref 0.0–0.5)
HEMATOCRIT: 33.4 % — AB (ref 34.8–46.6)
HEMOGLOBIN: 11.2 g/dL — AB (ref 11.6–15.9)
LYMPH#: 1 10*3/uL (ref 0.9–3.3)
LYMPH%: 23.2 % (ref 14.0–49.7)
MCH: 31 pg (ref 25.1–34.0)
MCHC: 33.7 g/dL (ref 31.5–36.0)
MCV: 91.9 fL (ref 79.5–101.0)
MONO#: 0.3 10*3/uL (ref 0.1–0.9)
MONO%: 7.5 % (ref 0.0–14.0)
NEUT%: 68.1 % (ref 38.4–76.8)
NEUTROS ABS: 2.8 10*3/uL (ref 1.5–6.5)
PLATELETS: 118 10*3/uL — AB (ref 145–400)
RBC: 3.63 10*6/uL — ABNORMAL LOW (ref 3.70–5.45)
RDW: 14.1 % (ref 11.2–14.5)
WBC: 4.1 10*3/uL (ref 3.9–10.3)

## 2016-04-18 LAB — COMPREHENSIVE METABOLIC PANEL
ANION GAP: 7 meq/L (ref 3–11)
AST: 10 U/L (ref 5–34)
Albumin: 3.4 g/dL — ABNORMAL LOW (ref 3.5–5.0)
Alkaline Phosphatase: 81 U/L (ref 40–150)
BILIRUBIN TOTAL: 0.92 mg/dL (ref 0.20–1.20)
BUN: 16.8 mg/dL (ref 7.0–26.0)
CALCIUM: 10.5 mg/dL — AB (ref 8.4–10.4)
CHLORIDE: 116 meq/L — AB (ref 98–109)
CO2: 22 mEq/L (ref 22–29)
CREATININE: 1.1 mg/dL (ref 0.6–1.1)
EGFR: 57 mL/min/{1.73_m2} — ABNORMAL LOW (ref >90)
Glucose: 96 mg/dl (ref 70–140)
Potassium: 4.2 mEq/L (ref 3.5–5.1)
Sodium: 144 mEq/L (ref 136–145)
TOTAL PROTEIN: 6.9 g/dL (ref 6.4–8.3)

## 2016-04-18 MED ORDER — SODIUM CHLORIDE 0.9 % IJ SOLN
10.0000 mL | INTRAMUSCULAR | Status: DC | PRN
  Administered 2016-04-18: 10 mL via INTRAVENOUS
  Filled 2016-04-18: qty 10

## 2016-04-18 MED ORDER — HEPARIN SOD (PORK) LOCK FLUSH 100 UNIT/ML IV SOLN
500.0000 [IU] | Freq: Once | INTRAVENOUS | Status: AC | PRN
  Administered 2016-04-18: 500 [IU] via INTRAVENOUS
  Filled 2016-04-18: qty 5

## 2016-04-18 NOTE — Progress Notes (Signed)
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  She had 5 cycles of neoadjuvant carboplatin VP16 from 10-26-15 (initial 2 cycles in [place]) thru 02-09-16, however still had extensive disease in the breast at mastectomy 03-15-16.   Last imaging was PET 01-25-16  Case was presented at Multidisciplinary Breast Conference 04-06-16. Path reviewed and confirmed small cell histology, with LVSI present tho the sentinel node negative. Radiation oncology recommended considering local radiation (now or after some additional chemo if that is given) due to suboptimal response in breast to initial chemo. Heart sparing radiation techniques could be used in [place], however patient travels from [place] such that daily radiation in [place] would be very difficult to manage. The radiation oncology facility at [facility] in [place] would be her preference if timing works for that facility.  She had echocardiogram in [place] 04-15-16, EF 60-65%. This study was done particularly for possible CAV chemo regimen.  Patient saw Dr Brantley Stage also on 04-15-16 and will see him again in 1-2 weeks, as skin area centrally in mastectomy scar is not healing well;  he may have mentioned flap coverage to her (?).  She will have PT for LUE close to her home, Dr Brantley Stage to do that referral,   Patient has felt well since she was here last. She has minimal discomfort at mastectomy site, no drainage, erythema or increased swelling. She has a little better ROM at left shoulder, not back to baseline. No swelling noticeable now in LUE. No fever. No SOB or other respiratory symptoms. Appetite better. Energy better. Bowels fine. No LE swelling. No bleeding. No problems with  PAC Remainder of 10 point Review of Systems negative  PAC replaced by IR on 12-30-15 Flu vaccine 04-04-16  ONCOLOGIC HISTORY Patient had not had regular medical care in a number of years when she noticed 2 masses in central left breast in Dec 2016. She was seen by Dr [name] in [place], with bilateral mammograms done 09-02-15 at [facility]. The mammograms, with no priors for comparison, showed in the left breast a dense irregular mass at least 3 cm in greatest dimension, with pleomorphic calcifications extending 5 cm inferomedially in left breast and with associated nipple inversion and retraction; also in the left breast was a 10 mm irregular mass at 9-10 o'clock. In the right breast an irregular mass centrally slightly medial to nipple 1.3 cm diameter. [place] on left breast showed the mass at 12 o'clock measured 2.8 x 2.4 x 2.5 cm with internal vascularity, and additional hypoechoic mass at 9 o'clock 0.8 x 0.4 x 0.6 cm. In right breast by [place] no suspicious solid or cystic mass identified. 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 Morphologically the neuroendocrine carcinoma is reported to show small to intermediate sized cells with high mitotic count and prominent necrosis, strongly positive for pancytokeratin and negative for chromogranin A, positive for synaptophysin, strongly positive for TTF-1, ER 1%, PR 105, Ki-67 of 96%. She subsequently had biopsy of the right breast lesion ([id number]) at [facility] no malignancy.  Staging studies done as follows:  Bone scan [place] 10-05-15 with bilateral anterior lower chest activity not clearly malignant, symmetrical renal activity, osteoarthritis left knee. CT CAP at [facility] 09-30-15 : Chest no hilar or mediastinal adenopathy,  thyroid normal, no pericardial effusion, no pulmonary lesions or pleural effusion, no lytic or blastic skeletal lesions. Abdomen numerous hepatic cysts without evidence of hepatic mets, adrenals mildly nodular, numerous renal cysts without hydronephrosis, spleen without focal lesions, pancreas normal, bowels unremarkable other than diverticulosis descending and sigmoid. No adenopathy. Pelvis no adenopathy, uterine fibroids, endometrium thickened at 23 mm , no ascites, bladder normal . PET Elvina Sidle 0-63-01 no hypermetabolic hilar or Mediastinal nodes, no pulmonary uptake, left breast mass with SUV 22.89 measuring 3.3 cm without uptake in axillary, retropectoral or supraclavicular nodes. An area of uptake anterior spleen with SUV max 5.63 corresponding to low density structure not present on 09-30-15, no liver concerns, no other uptake abdomen or pelvis. No mention of endometrial lining. Diffuse heterogeneous uptake thru axial and appendicular skeleton which would be consistent either with diffuse marrow/ bone involvement or anemia. Dr Jacquiline Doe discussed with IR, decision made not to attempt biopsy of spleen.  Dr Jacquiline Doe discussed case with breast oncologist Dr Myra Gianotti Muss and Dr L.Curey. Recommendation was for neoadjuvant carbo VP16 x 4 cycles with consideration of additional anthracyclines, then consideration of mastectomy with axillary node evaluation and further management depending on surgical findings.  Note patient refused MRI brain during initial staging.  Patient received cycle 1 carbo VP16 ~ May 1-2-3 in [place], those chemo records not included in outside information. She received neulasta, however developed profound neutropenia without febrile complications, thrombocytopenia and anemia (hgb 7.9) for which she was transfused 2 units PRBCs. Cycle 2 was given 5-22,23,24, reportedly dose reduced from cycle 1. Cycle 2 carbo dose total was 445.5 mg and VP16 118 mg, with decadron 12 mg and Aloxi 0.25 mg.  VP16 doses on 5-23 and 5-24 were each 118 mg total , with decadron 12 mg as only antiemetic. Family tells me that she had neulasta also after cycle 2, that information not in these outside records.  Labs 11-16-15 WBC 7.2, ANC 4.8, Hgb 10.2, plt 202k Chemistries date not clear to me (listed as "2 weeks ago") creat 1.01, BUN 15, calcium 9.9, Tprot 6.4, alb 3.65, AP 90, AST 18, Tbili 0.7, ALT 15, Na 145, k 4.3, Cl 111, glu 94  PerDr Darovsky, the left breast mass hadimproved only slightly with chemo given thru cycle 2. Cycle 3 carbo VP16 given at [facility] 6-19 thru 12-16-15, with neulasta continued. Due to concerns that the breast mass was not responding as expected, she had restaging CT CAP on 01-01-16 ,which showed breast mass 2.8 x 2.6 cm as compared with 3.3 x 2.6 cm on imaging 09-2015, stable area in spleen. Outside pathology was reviewed by [facility] pathologist, agreed with previous diagnosis. Cycle 4 Carbo VP16 was given 7-17 thru 01-13-16.Follow up PET after cycle 4, on 01-25-16 showed uptake in left breast mass SUV 17.4 compared with 22.9 previously, no hypermetabolic axillary or mediastinal nodes, stable uptake 5.5 in spleen with calcifications there identified on noncontrast images suggesting benign, diffuse marrow activity consistent with anemia and gCSF, probable gastritis. Cycle 5 was given days 1 and 2 only due to concern for cytopenias and timing of upcoming surgery. She had left mastectomy with sentinel axillary lymph node evaluation by Dr Brantley Stage on 03-15-16. Pathology [id number]) from 03-15-16 left mastectomy poorly differentiated neuroendocrine carcinoma grade 3, size 4.5 cm, margins and skin/ nipple/  skeletal muscle not involved with closest margin posterior at 3 mm, 3 sentinel nodes negative. Review of path confirms small cell features and LVSI present.   Objective:  Vital signs in last 24 hours:  BP (!) 189/84 (BP Location: Right Arm, Patient Position: Sitting)   Pulse 78   Temp  98.1 F (36.7 C) (Oral)   Resp 18   Ht 5' 3" (1.6 m)   Wt 189 lb 6.4 oz (85.9 kg)   SpO2 100%   BMI 33.55 kg/m  Weight up 2.5 lbs Alert, oriented and appropriate, looks comfortable, respirations not labored, very pleasant as always.. Ambulatory without difficulty.   HEENT:PERRL, sclerae not icteric. Oral mucosa moist without lesions, posterior pharynx clear.  Neck supple. No JVD.  Lymphatics:no cervical,supraclavicular, axillary adenopathy Resp: clear to auscultation bilaterally and normal percussion bilaterally Cardio: regular rate and rhythm. No gallop. GI: abdomen soft, nontender, no mass or organomegaly. Normally active bowel sounds.  Musculoskeletal/ Extremities: without pitting edema, cords, tenderness Neuro:  nonfocal  PSYCH appropriate mood and affect Skin without rash, ecchymosis, petechiae Breasts: Right breast without dominant mass, skin or nipple findings. Left mastectomy scar wth central dark skin ~ 4 cm diameter, incision closed, no erythema or heat, scar closed. Axillae benign. No clear swelling LUE Portacath-without erythema or tenderness, flushed with good blood return today  Lab Results:  Results for orders placed or performed in visit on 04/18/16  CBC with Differential  Result Value Ref Range   WBC 4.1 3.9 - 10.3 10e3/uL   NEUT# 2.8 1.5 - 6.5 10e3/uL   HGB 11.2 (L) 11.6 - 15.9 g/dL   HCT 33.4 (L) 34.8 - 46.6 %   Platelets 118 (L) 145 - 400 10e3/uL   MCV 91.9 79.5 - 101.0 fL   MCH 31.0 25.1 - 34.0 pg   MCHC 33.7 31.5 - 36.0 g/dL   RBC 3.63 (L) 3.70 - 5.45 10e6/uL   RDW 14.1 11.2 - 14.5 %   lymph# 1.0 0.9 - 3.3 10e3/uL   MONO# 0.3 0.1 - 0.9 10e3/uL   Eosinophils Absolute 0.0 0.0 - 0.5 10e3/uL   Basophils Absolute 0.0 0.0 - 0.1 10e3/uL   NEUT% 68.1 38.4 - 76.8 %   LYMPH% 23.2 14.0 - 49.7 %   MONO% 7.5 0.0 - 14.0 %   EOS% 0.6 0.0 - 7.0 %   BASO% 0.6 0.0 - 2.0 %  Comprehensive metabolic panel  Result Value Ref Range   Sodium 144 136 - 145 mEq/L    Potassium 4.2 3.5 - 5.1 mEq/L   Chloride 116 (H) 98 - 109 mEq/L   CO2 22 22 - 29 mEq/L   Glucose 96 70 - 140 mg/dl   BUN 16.8 7.0 - 26.0 mg/dL   Creatinine 1.1 0.6 - 1.1 mg/dL   Total Bilirubin 0.92 0.20 - 1.20 mg/dL   Alkaline Phosphatase 81 40 - 150 U/L   AST 10 5 - 34 U/L   ALT <6 0-55 U/L U/L   Total Protein 6.9 6.4 - 8.3 g/dL   Albumin 3.4 (L) 3.5 - 5.0 g/dL   Calcium 10.5 (H) 8.4 - 10.4 mg/dL   Anion Gap 7 3 - 11 mEq/L   EGFR 57 (L) >90 ml/min/1.73 m2     Studies/Results:  Echocardiogram 04/16/2016 reviewed.   Transthoracic echocardiography.  M-mode, complete 2D, spectral Doppler, and color Doppler.  Birthdate:  Patient birthdate: Jun 30, 1945.  Age:  Patient is 70 yr old.  Sex:  Gender: female. BMI: 33.2 kg/m^2.  Blood pressure:  124/78  Patient status: Outpatient.  Study date:  Study date: 04/15/2016. Study time: 11:11 AM.  Location:  Echo laboratory.  -------------------------------------------------------------------  ------------------------------------------------------------------- Left ventricle:  The cavity size was normal. There was mild concentric hypertrophy. Systolic function was normal. The estimated ejection fraction was in the range of 60% to 65%. Wall motion was normal; there were no regional wall motion abnormalities. Doppler parameters are consistent with abnormal left ventricular relaxation (grade 1 diastolic dysfunction). Doppler parameters are consistent with elevated ventricular end-diastolic filling pressure.  ------------------------------------------------------------------- Aortic valve:   Trileaflet; normal thickness leaflets. Mobility was not restricted.  Doppler:  Transvalvular velocity was within the normal range. There was no stenosis. There was no regurgitation.   ------------------------------------------------------------------- Aorta:  Aortic root: The aortic root was normal in  size.  ------------------------------------------------------------------- Mitral valve:   Structurally normal valve.   Mobility was not restricted.  Doppler:  Transvalvular velocity was within the normal range. There was no evidence for stenosis. There was mild regurgitation.  ------------------------------------------------------------------- Left atrium:  The atrium was at the upper limits of normal in size.   ------------------------------------------------------------------- Right ventricle:  The cavity size was normal. Wall thickness was normal. Systolic function was normal.  ------------------------------------------------------------------- Pulmonic valve:    Structurally normal valve.   Cusp separation was normal.  Doppler:  Transvalvular velocity was within the normal range. There was no evidence for stenosis. There was no regurgitation.  ------------------------------------------------------------------- Tricuspid valve:   Structurally normal valve.    Doppler: Transvalvular velocity was within the normal range. There was trivial regurgitation.  ------------------------------------------------------------------- Pulmonary artery:   The main pulmonary artery was normal-sized. Systolic pressure was within the normal range.  ------------------------------------------------------------------- Right atrium:  The atrium was normal in size.  ------------------------------------------------------------------- Pericardium:  There was no pericardial effusion.  ------------------------------------------------------------------- Systemic veins: Inferior vena cava: The vessel was normal in size.    Medications: I have reviewed the patient's current medications.  DISCUSSION Wound healing discussed, tho I do not have most recent note from Dr Brantley Stage.   Note further systemic treatment previously discussed with my partner in lung oncology, who thought close  observation ok now, then if needed Cisplatinum 30 and CPT-11 26m days 1 and 8 every 3 weeks, or CAV  Information from Multidisciplinary Breast Conference discussed. With possible time limitations for radiation, as not too responsive to up front chemo, and as no distant disease by last scans, we have decided to ask [place]. Message left at that office and patient will let [place] if she does not hear back in next 1-2 days. I will see her at least in ~ 4 weeks if radiation, or sooner if decision is made not to do radiation.    Assessment/Plan:   1.small cell carcinoma left breast: initially limited disease vs possibly metastatic to spleen, and with bone findings on scans of unclear etiology. Partial response to initial carbo VP16, [place] [facility]. Clinically and by CT only partial response of the breast mass. PET after cycle 4 still suggests limited disease, tho also still significant activity in breast. Due to timing of surgery, she then had cycle 5  days 1 and 2 only on 8-14/ 8-15. Left mastectomy with 3 sentinel nodes done by Dr CBrantley Stage9-19-17, with residual 4 cm grade 3 neuroendocrine (small cell) carcinoma in breast, negative sentinel nodes, negative margins, + LVSI. Wound not healing optimally, central skin likely  not viable. Considerations further chemo vs radiation as above, radiation oncology consult requested in [place]. 2. Chemo neutropenia despite neulasta cycle 1, chemo thrombocytopenia and anemia requiring PRBCs also cycle 1. Cycle 2 was dose reduced, neulasta continued.  Anemia: iron WNL 11-2015, B12 fine, may be chemo related.  3.chemo thrombocytopenia: platelets 60k now, no bleeding, expect platelets should recover in next ~ 2 weeks in time for surgery. WIll have repear CBC preop. 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  5.family history of breast cancer in  daughter, pancreatic in father, "bone cancer" in mother and prostate cancer in brother 6.dental and gum disease: biotene, may need to ask dental medicine to see 7.osteoarthritis left knee 8.has declined information re advance directives 9..hypertension: untreated at presentation to gyn Dr Evie Lacks. Continue tenormin. Patient aware that she should establish with a PCP. 10.gastritis likely by PET. Now on daily protonix, no symptoms since on protonix 11. PAC replaced by IR 12-30-15 (initial central line not in correct position) 12. Flu vaccine 04-04-16  All questions answered and patient/ husband are in agreement with plans above, know to call if needed prior to next scheduled appointment. Time spent 30 min including >50% counseling and coordination of care. CC radiation oncology and Dr Brantley Stage   Evlyn Clines, MD   04/18/2016, 2:40 PM

## 2016-04-19 ENCOUNTER — Telehealth: Payer: Self-pay | Admitting: *Deleted

## 2016-04-19 NOTE — Telephone Encounter (Signed)
-----   Message from [name], MD sent at 04/18/2016  1:10 PM EDT ----- Regarding: rad onc in [place] I am trying to get her seen by radiation onc at [place] [facility] I understand that office is still operational at least until they make decision about it in early Dec I had to LM at that office on 10-23 to call back to RN or to my cell  thanks

## 2016-04-19 NOTE — Telephone Encounter (Signed)
Referral called and faxed all information to [facility] radiation oncology.  [place]- ph [phone]

## 2016-04-20 ENCOUNTER — Telehealth: Payer: Self-pay | Admitting: *Deleted

## 2016-04-20 ENCOUNTER — Encounter: Payer: Self-pay | Admitting: Oncology

## 2016-04-20 DIAGNOSIS — Z9012 Acquired absence of left breast and nipple: Secondary | ICD-10-CM | POA: Insufficient documentation

## 2016-04-20 NOTE — Telephone Encounter (Addendum)
Ava, from [facility].Hospital called regarding a referral they received. She asked Dr. Mariana Kaufman nurse Barbaraann Share could give her a call back @ [phone]

## 2016-04-20 NOTE — Telephone Encounter (Signed)
Ms Hagin is scheduled with Dr. Lisbeth Renshaw tomorrow 04-21-16 at 0930 for visit and then simulation at 1120. Pt aware of appointment.

## 2016-04-21 ENCOUNTER — Telehealth: Payer: Self-pay | Admitting: Oncology

## 2016-04-21 NOTE — Telephone Encounter (Signed)
Faxed medical records to Dr. Lisbeth Renshaw [phone]

## 2016-04-25 ENCOUNTER — Telehealth: Payer: Self-pay | Admitting: Oncology

## 2016-04-25 NOTE — Telephone Encounter (Signed)
Not able to reach patient by phone or lm re 11/20 appointments. Schedule mailed. Referral to radonc/[place] has already been initiated.

## 2016-04-26 NOTE — Telephone Encounter (Signed)
Maria Strickland call about a missed phone call from yesterday.  Told her her appointments as scheduled on 05-16-16 with lab/flush and Dr. Marko Plume  beginning at 1015 am. Maria Strickland verbalized understanding.

## 2016-04-27 ENCOUNTER — Ambulatory Visit: Payer: Medicare PPO

## 2016-04-27 ENCOUNTER — Ambulatory Visit: Payer: Medicare PPO | Admitting: Radiation Oncology

## 2016-04-27 ENCOUNTER — Telehealth: Payer: Self-pay | Admitting: Physical Therapy

## 2016-05-06 ENCOUNTER — Other Ambulatory Visit: Payer: Self-pay

## 2016-05-06 DIAGNOSIS — C801 Malignant (primary) neoplasm, unspecified: Secondary | ICD-10-CM

## 2016-05-06 MED ORDER — PANTOPRAZOLE SODIUM 40 MG PO TBEC: 40.0000 mg | DELAYED_RELEASE_TABLET | Freq: Every day | ORAL | 2 refills | Status: DC

## 2016-05-09 ENCOUNTER — Encounter: Payer: Self-pay | Admitting: Oncology

## 2016-05-09 NOTE — Progress Notes (Signed)
Medical Oncology  Note received from [facility], Dr Kyung Rudd, from consultation 04-21-16. He is in agreement with post mastectomy radiation after wound healing , with estimated >=3-4 weeks for healing. He notes visit for surgical follow up scheduled.  Radiation Oncology consultation note to be scanned into this EMR  L.Marko Plume, MD

## 2016-05-15 ENCOUNTER — Other Ambulatory Visit: Payer: Self-pay | Admitting: Oncology

## 2016-05-16 ENCOUNTER — Ambulatory Visit (HOSPITAL_BASED_OUTPATIENT_CLINIC_OR_DEPARTMENT_OTHER): Payer: Medicare PPO | Admitting: Oncology

## 2016-05-16 ENCOUNTER — Encounter: Payer: Self-pay | Admitting: Oncology

## 2016-05-16 ENCOUNTER — Other Ambulatory Visit (HOSPITAL_BASED_OUTPATIENT_CLINIC_OR_DEPARTMENT_OTHER): Payer: Medicare PPO

## 2016-05-16 ENCOUNTER — Ambulatory Visit (HOSPITAL_BASED_OUTPATIENT_CLINIC_OR_DEPARTMENT_OTHER): Payer: Medicare PPO

## 2016-05-16 VITALS — BP 168/74 | HR 71 | Temp 98.1°F | Resp 18 | Ht 63.0 in | Wt 190.0 lb

## 2016-05-16 DIAGNOSIS — D241 Benign neoplasm of right breast: Secondary | ICD-10-CM | POA: Diagnosis not present

## 2016-05-16 DIAGNOSIS — Z95828 Presence of other vascular implants and grafts: Secondary | ICD-10-CM

## 2016-05-16 DIAGNOSIS — C801 Malignant (primary) neoplasm, unspecified: Secondary | ICD-10-CM

## 2016-05-16 DIAGNOSIS — T148XXD Other injury of unspecified body region, subsequent encounter: Secondary | ICD-10-CM

## 2016-05-16 DIAGNOSIS — M1712 Unilateral primary osteoarthritis, left knee: Secondary | ICD-10-CM

## 2016-05-16 DIAGNOSIS — C50812 Malignant neoplasm of overlapping sites of left female breast: Secondary | ICD-10-CM

## 2016-05-16 DIAGNOSIS — C7A1 Malignant poorly differentiated neuroendocrine tumors: Secondary | ICD-10-CM

## 2016-05-16 DIAGNOSIS — Z9012 Acquired absence of left breast and nipple: Secondary | ICD-10-CM

## 2016-05-16 DIAGNOSIS — Z452 Encounter for adjustment and management of vascular access device: Secondary | ICD-10-CM | POA: Diagnosis not present

## 2016-05-16 DIAGNOSIS — I10 Essential (primary) hypertension: Secondary | ICD-10-CM | POA: Diagnosis not present

## 2016-05-16 DIAGNOSIS — D6481 Anemia due to antineoplastic chemotherapy: Secondary | ICD-10-CM | POA: Diagnosis not present

## 2016-05-16 LAB — CBC WITH DIFFERENTIAL/PLATELET
BASO%: 0.5 % (ref 0.0–2.0)
Basophils Absolute: 0 10*3/uL (ref 0.0–0.1)
EOS ABS: 0 10*3/uL (ref 0.0–0.5)
EOS%: 0.5 % (ref 0.0–7.0)
HEMATOCRIT: 32.1 % — AB (ref 34.8–46.6)
HGB: 11.2 g/dL — ABNORMAL LOW (ref 11.6–15.9)
LYMPH%: 22.7 % (ref 14.0–49.7)
MCH: 30.3 pg (ref 25.1–34.0)
MCHC: 34.9 g/dL (ref 31.5–36.0)
MCV: 86.8 fL (ref 79.5–101.0)
MONO#: 0.3 10*3/uL (ref 0.1–0.9)
MONO%: 7.2 % (ref 0.0–14.0)
NEUT%: 69.1 % (ref 38.4–76.8)
NEUTROS ABS: 2.9 10*3/uL (ref 1.5–6.5)
NRBC: 0 % (ref 0–0)
PLATELETS: 106 10*3/uL — AB (ref 145–400)
RBC: 3.7 10*6/uL (ref 3.70–5.45)
RDW: 13.8 % (ref 11.2–14.5)
WBC: 4.2 10*3/uL (ref 3.9–10.3)
lymph#: 1 10*3/uL (ref 0.9–3.3)

## 2016-05-16 LAB — COMPREHENSIVE METABOLIC PANEL
ALT: 6 U/L (ref 0–55)
ANION GAP: 6 meq/L (ref 3–11)
AST: 10 U/L (ref 5–34)
Albumin: 3.3 g/dL — ABNORMAL LOW (ref 3.5–5.0)
Alkaline Phosphatase: 86 U/L (ref 40–150)
BILIRUBIN TOTAL: 1.03 mg/dL (ref 0.20–1.20)
BUN: 19.3 mg/dL (ref 7.0–26.0)
CHLORIDE: 116 meq/L — AB (ref 98–109)
CO2: 21 meq/L — AB (ref 22–29)
CREATININE: 1 mg/dL (ref 0.6–1.1)
Calcium: 10.6 mg/dL — ABNORMAL HIGH (ref 8.4–10.4)
EGFR: 63 mL/min/{1.73_m2} — ABNORMAL LOW (ref >90)
Glucose: 96 mg/dl (ref 70–140)
Potassium: 4.1 mEq/L (ref 3.5–5.1)
Sodium: 143 mEq/L (ref 136–145)
TOTAL PROTEIN: 6.9 g/dL (ref 6.4–8.3)

## 2016-05-16 MED ORDER — SODIUM CHLORIDE 0.9 % IJ SOLN
10.0000 mL | INTRAMUSCULAR | Status: DC | PRN
  Administered 2016-05-16: 10 mL via INTRAVENOUS
  Filled 2016-05-16: qty 10

## 2016-05-16 MED ORDER — HEPARIN SOD (PORK) LOCK FLUSH 100 UNIT/ML IV SOLN
500.0000 [IU] | Freq: Once | INTRAVENOUS | Status: AC | PRN
  Administered 2016-05-16: 500 [IU] via INTRAVENOUS
  Filled 2016-05-16: qty 5

## 2016-05-16 NOTE — Progress Notes (Signed)
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 Plan is for local radiation in [place], timing depending on wound healing from the mastectomy. Last imaging was PET 01-25-16.  Patient had consultation with Dr Kyung Rudd on 04-21-16. She is to see Dr Brantley Stage next on 05-27-16. I have spoken directly with radiation oncology at [facility], visit scheduled with Dr Lianne Cure 06-02-16 0800,  with CT simulation following MD. Patient has begun physical therapy in [place], which seems to be helping LUE already, per patient massage, weights and ROM at shoulder. She did not have left shoulder symptoms prior to surgery.   The wound area is gradually improving, with thick central eschar or necrotic tissue loosening, no pain, no bleeding or drainage. She is using neosporin to the area as instructed by Dr Brantley Stage.  Otherwise patient feels very well, going about all usual activities, energy good, appetite fine, no SOB or other respiratory symptoms, no pain, no nausea, no problems with bowels, no fever or symptoms of infection, no bleeding, no HA, no recent falls. No noted changes in right breast. No problems with PAC. Remainder of 10 point Review of Systems negative.   PAC replaced by IR on 12-30-15, flushed 05-16-16 Flu vaccine 04-04-16  ONCOLOGIC HISTORY Patient had not had regular medical care in a number of years when she noticed 2 masses in central left breast in Dec 2016. She was seen by Dr [name] in [place], with bilateral mammograms done  09-02-15 at [facility]. The mammograms, with no priors for comparison, showed in the left breast a dense irregular mass at least 3 cm in greatest dimension, with pleomorphic calcifications extending 5 cm inferomedially in left breast and with associated nipple inversion and retraction; also in the left breast was a 10 mm irregular mass at 9-10 o'clock. In the right breast an irregular mass centrally slightly medial to nipple 1.3 cm diameter. [place] on left breast showed the mass at 12 o'clock measured 2.8 x 2.4 x 2.5 cm with internal vascularity, and additional hypoechoic mass at 9 o'clock 0.8 x 0.4 x 0.6 cm. In right breast by [place] no suspicious solid or cystic mass identified. 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 Morphologically the neuroendocrine carcinoma is reported to show small to intermediate sized cells with high mitotic count and prominent necrosis, strongly positive for pancytokeratin and negative for chromogranin A, positive for synaptophysin, strongly positive for TTF-1, ER 1%, PR 105, Ki-67 of 96%. She subsequently had biopsy of the right breast lesion ([id number]) at [facility], no malignancy.  Staging studies done as follows:  Bone scan [place] 10-05-15 with bilateral anterior lower chest activity not clearly malignant, symmetrical renal activity, osteoarthritis left knee. CT CAP at [facility] 09-30-15 : Chest no hilar or mediastinal adenopathy, thyroid normal, no pericardial effusion, no pulmonary lesions or pleural effusion, no lytic or blastic skeletal lesions. Abdomen numerous hepatic cysts without evidence of  hepatic mets, adrenals mildly nodular, numerous renal cysts without hydronephrosis, spleen without focal lesions, pancreas normal, bowels unremarkable  other than diverticulosis descending and sigmoid. No adenopathy. Pelvis no adenopathy, uterine fibroids, endometrium thickened at 23 mm , no ascites, bladder normal . PET Elvina Sidle 9-67-59 no hypermetabolic hilar or Mediastinal nodes, no pulmonary uptake, left breast mass with SUV 22.89 measuring 3.3 cm without uptake in axillary, retropectoral or supraclavicular nodes. An area of uptake anterior spleen with SUV max 5.63 corresponding to low density structure not present on 09-30-15, no liver concerns, no other uptake abdomen or pelvis. No mention of endometrial lining. Diffuse heterogeneous uptake thru axial and appendicular skeleton which would be consistent either with diffuse marrow/ bone involvement or anemia. Dr Jacquiline Doe discussed with IR, decision made not to attempt biopsy of spleen.  Dr Jacquiline Doe discussed case with breast oncologist Dr Myra Gianotti Muss and Dr L.Curey. Recommendation was for neoadjuvant carbo VP16 x 4 cycles with consideration of additional anthracyclines, then consideration of mastectomy with axillary node evaluation and further management depending on surgical findings.  Note patient refused MRI brain during initial staging.  Patient received cycle 1 carbo VP16 ~ May 1-2-3 in [place], those chemo records not included in outside information. She received neulasta, however developed profound neutropenia without febrile complications, thrombocytopenia and anemia (hgb 7.9) for which she was transfused 2 units PRBCs. Cycle 2 was given 5-22,23,24, reportedly dose reduced from cycle 1. Cycle 2 carbo dose total was 445.5 mg and VP16 118 mg, with decadron 12 mg and Aloxi 0.25 mg. VP16 doses on 5-23 and 5-24 were each 118 mg total , with decadron 12 mg as only antiemetic. Family tells me that she had neulasta also after cycle 2, that information not in these outside records.  Labs 11-16-15 WBC 7.2, ANC 4.8, Hgb 10.2, plt 202k Chemistries date not clear to me (listed as "2 weeks ago") creat 1.01,  BUN 15, calcium 9.9, Tprot 6.4, alb 3.65, AP 90, AST 18, Tbili 0.7, ALT 15, Na 145, k 4.3, Cl 111, glu 94  PerDr Darovsky, the left breast mass hadimproved only slightly with chemo given thru cycle 2. Cycle 3 carbo VP16 given at [facility] 6-19 thru 12-16-15, with neulasta continued. Due to concerns that the breast mass was not responding as expected, she had restaging CT CAP on 01-01-16 ,which showed breast mass 2.8 x 2.6 cm as compared with 3.3 x 2.6 cm on imaging 09-2015, stable area in spleen. Outside pathology was reviewed by [facility] pathologist, agreed with previous diagnosis. Cycle 4 Carbo VP16 was given 7-17 thru 01-13-16.Follow up PET after cycle 4, on 01-25-16 showed uptake in left breast mass SUV 17.4 compared with 22.9 previously, no hypermetabolic axillary or mediastinal nodes, stable uptake 5.5 in spleen with calcifications there identified on noncontrast images suggesting benign, diffuse marrow activity consistent with anemia and gCSF, probable gastritis. Cycle 5 was given days 1 and 2 only due to concern for cytopenias and timing of upcoming surgery. She had left mastectomy with sentinel axillary lymph node evaluation by Dr Brantley Stage on 03-15-16. Pathology [id number]) from 03-15-16 left mastectomy poorly differentiated neuroendocrine carcinoma grade 3, size 4.5 cm, margins and skin/ nipple/ skeletal muscle not involved with closest margin posterior at 3 mm, 3 sentinel nodes negative. Review of path confirms small cell features and LVSI present.   Objective:  Vital signs in last 24 hours:  BP (!) 168/74 (BP Location: Right Arm, Patient Position: Sitting) Comment: informed Louise  Pulse 71   Temp 98.1  F (36.7 C) (Oral)   Resp 18   Ht _0  (1.6 m)   Wt 190 lb (86.2 kg)   SpO2 100%   BMI 33.66 kg/m  Weight up 1 lb Alert, oriented and appropriate, very pleasant as always. Ambulatory without difficulty. Looks comfortable, respirations not labored. Hair is growing back  HEENT:PERRL,  sclerae not icteric. Oral mucosa moist without lesions, poor dentition unchanged,  posterior pharynx clear.  Neck supple. No JVD.  Lymphatics:no cervical,supraclavicular, axillary adenopathy Resp: clear to auscultation bilaterally and normal percussion bilaterally Cardio: regular rate and rhythm. No gallop. GI: soft, nontender, not distended, no mass or organomegaly. Normally active bowel sounds.  Musculoskeletal/ Extremities: LUE no obvious lymphedema. Able to elevate just above 90 degrees at shoulder.  LE without pitting edema, cords, tenderness Neuro: no peripheral neuropathy. Otherwise nonfocal.  Psych: appropriate mood and affect Skin without rash, ecchymosis, petechiae Breasts: Left mastectomy scar healed medially and laterally. Central hard black eschar ~ 2.5 - 3 cm x 5 cm. No surrounding erythema, tenderness or swelling. Right breast without dominant mass, skin or nipple findings. Axillae benign. Portacath-without erythema or tenderness, flushed with blood return today.  Lab Results:  Results for orders placed or performed in visit on 05/16/16  CBC with Differential  Result Value Ref Range   WBC 4.2 3.9 - 10.3 10e3/uL   NEUT# 2.9 1.5 - 6.5 10e3/uL   HGB 11.2 (L) 11.6 - 15.9 g/dL   HCT 32.1 (L) 34.8 - 46.6 %   Platelets 106 (L) 145 - 400 10e3/uL   MCV 86.8 79.5 - 101.0 fL   MCH 30.3 25.1 - 34.0 pg   MCHC 34.9 31.5 - 36.0 g/dL   RBC 3.70 3.70 - 5.45 10e6/uL   RDW 13.8 11.2 - 14.5 %   lymph# 1.0 0.9 - 3.3 10e3/uL   MONO# 0.3 0.1 - 0.9 10e3/uL   Eosinophils Absolute 0.0 0.0 - 0.5 10e3/uL   Basophils Absolute 0.0 0.0 - 0.1 10e3/uL   NEUT% 69.1 38.4 - 76.8 %   LYMPH% 22.7 14.0 - 49.7 %   MONO% 7.2 0.0 - 14.0 %   EOS% 0.5 0.0 - 7.0 %   BASO% 0.5 0.0 - 2.0 %   nRBC 0 0 - 0 %  Comprehensive metabolic panel  Result Value Ref Range   Sodium 143 136 - 145 mEq/L   Potassium 4.1 3.5 - 5.1 mEq/L   Chloride 116 (H) 98 - 109 mEq/L   CO2 21 (L) 22 - 29 mEq/L   Glucose 96 70 - 140  mg/dl   BUN 19.3 7.0 - 26.0 mg/dL   Creatinine 1.0 0.6 - 1.1 mg/dL   Total Bilirubin 1.03 0.20 - 1.20 mg/dL   Alkaline Phosphatase 86 40 - 150 U/L   AST 10 5 - 34 U/L   ALT 6 0 - 55 U/L   Total Protein 6.9 6.4 - 8.3 g/dL   Albumin 3.3 (L) 3.5 - 5.0 g/dL   Calcium 10.6 (H) 8.4 - 10.4 mg/dL   Anion Gap 6 3 - 11 mEq/L   EGFR 63 (L) >90 ml/min/1.73 m2     Studies/Results:  No results found.  Medications: I have reviewed the patient's current medications.  DISCUSSION Gradual improvement in surgical area. We appreciate radiation oncology seeing her in [place] shortly after her next visit with Dr Brantley Stage. Next appointment with medical oncology will be early Jan coordinating with PAC flush, tho certainly can be seen sooner if needed.  Clinically she does not  have anything of obvious concern for progressive or metastatic small cell, tho certainly at significant risk.  Encouraged her to continue PT including ROM shoulder, as this may be helpful for positioning for radiation.  Patient is aware that another medical oncologist will be following her after first of year. Note care began with Dr Jacquiline Doe.  Assessment/Plan: 1.small cell carcinoma left breast: initially limited disease vs possibly metastatic to spleen, and with bone findings on scans of unclear etiology. Partial response to initial carbo VP16, first 2 cycles in [place] with Dr Jacquiline Doe. Cycle 3 given at [facility] 19-[facility]21 with neulasta. Clinically and by CT only partial response of the breast mass. PET after cycle 4still suggests limited disease,tho also still significant activity in breast. Due to timing of surgery, she then had cycle 5  days 1 and 2 only on 8-14/ 8-15. Left mastectomy with 3 sentinel nodes done by Dr Brantley Stage 03-15-16, with residual 4 cm grade 3 neuroendocrine (small cell) carcinoma in breast, negative sentinel nodes, negative margins, + LVSI. Wound healing slow but improved.  Plan local radiation in [place] when appropriate.   2. Cytopenias with chemotherapy requiring gCSF and transfusion. Improved, still mild anemia 3. PAC replaced by IR 12-30-15, to be kept flushed every 6-8 weeks when not otherwise used. Peripheral IV access very difficult 4.FIbroadenoma right breast by stereotactic biopsy 10-14-15. Due right mammogram 09-2016 (last 10-12-15) 5.family history of breast cancer in daughter, pancreatic in father, "bone cancer" in mother and prostate cancer in brother 6.dental and gum disease: biotene. She has not wanted dental referral 7.osteoarthritis left knee 8.has declined information re advance directives 9..hypertension: untreated at presentation to gyn Dr Evie Lacks. Continue tenormin. Patient aware that she should establish with a PCP. 10.gastritis likely by PET. Now ondaily protonix, no symptoms since on protonix 11. Flu vaccine 04-04-16 12.some limited ROM left shoulder since surgery: PT ongoing in [place]  All questions answered and patient is in agreement with recommendations and plans. Time spent 25 min including >50% counseling and coordination of care. Cc Drs Brantley Stage, [place], Zagar, Buist      Evlyn Clines, MD   05/17/2016, 5:05 PM

## 2016-05-17 DIAGNOSIS — T148XXD Other injury of unspecified body region, subsequent encounter: Secondary | ICD-10-CM | POA: Insufficient documentation

## 2016-05-17 DIAGNOSIS — D241 Benign neoplasm of right breast: Secondary | ICD-10-CM | POA: Insufficient documentation

## 2016-05-30 ENCOUNTER — Telehealth: Payer: Self-pay | Admitting: Oncology

## 2016-05-30 NOTE — Telephone Encounter (Signed)
Faxed 05/16/16 office note to Dr. Lianne Cure

## 2016-05-31 ENCOUNTER — Other Ambulatory Visit: Payer: Self-pay

## 2016-05-31 DIAGNOSIS — I10 Essential (primary) hypertension: Secondary | ICD-10-CM

## 2016-05-31 DIAGNOSIS — C801 Malignant (primary) neoplasm, unspecified: Secondary | ICD-10-CM

## 2016-05-31 MED ORDER — ATENOLOL 50 MG PO TABS: 50.0000 mg | ORAL_TABLET | Freq: Every day | ORAL | 1 refills | Status: AC

## 2016-05-31 MED ORDER — PANTOPRAZOLE SODIUM 40 MG PO TBEC: 40.0000 mg | DELAYED_RELEASE_TABLET | Freq: Every day | ORAL | 1 refills | Status: AC

## 2016-07-08 ENCOUNTER — Other Ambulatory Visit: Payer: Self-pay | Admitting: Oncology

## 2016-07-08 ENCOUNTER — Telehealth: Payer: Self-pay

## 2016-07-08 NOTE — Telephone Encounter (Signed)
-----   Message from [name], MD sent at 07/08/2016 10:25 AM EST ----- Triage note: Pt called c/o joint pain in back arms and legs. It started with the XRT. She is receiving XRT in [place] and Dr Lianne Cure is only prescibing XS tylenol. Pt says it may help at times but not enough. She states she is not using her oxycodone b/c even 1 pill will constipate her. The pain is keeping her awake at night. She was asking about possibly tramadol or another pain med. She has not tried heat. She has 12 more treatments remaining.  MD reply: Have her try aleve or ibuprofen with food and plenty of fluids twice daily.  She can use 1/2 oxycodone if needed, just should take miralax or senokot 1-2x daily with the oxycodone.  When is she to see Dr Lianne Cure next? I expect he sees her at least once weekly on RT; need to be sure he is aware. May be positioning from radiation, which may not be able to avoid. Let me see her next week + CBC CMET, Thurs new pt spot or Mon 4:00 thanks

## 2016-07-08 NOTE — Telephone Encounter (Signed)
Pt called c/o joint pain in back arms and legs. It started with the XRT. She is receiving XRT in [place] and Dr Lianne Cure is only prescibing XS tylenol. Pt says it may help at times but not enough. She states she is not using her oxycodone b/c even 1 pill will constipate her. The pain is keeping her awake at night. She was asking about possibly tramadol or another pain med. She has not tried heat. She has 12 more treatments remaining.

## 2016-07-08 NOTE — Telephone Encounter (Signed)
Spoke with ms Maria Strickland.  Reviewed [name] plan of care as noted below. Ms Maria Strickland wrote instructions down and repeated them correctly to this nurse. She will come to see [name] Monday. At 4 pm. Appointments made. She sees [name] on Thursday 07-14-16 after radiation. Encouraged her to discuss pain with radiation therapists Monday so they could  discuss with [name].

## 2016-07-10 ENCOUNTER — Other Ambulatory Visit: Payer: Self-pay | Admitting: Oncology

## 2016-07-11 ENCOUNTER — Encounter: Payer: Self-pay | Admitting: Oncology

## 2016-07-11 ENCOUNTER — Ambulatory Visit: Payer: Medicare PPO

## 2016-07-11 ENCOUNTER — Ambulatory Visit (HOSPITAL_BASED_OUTPATIENT_CLINIC_OR_DEPARTMENT_OTHER): Payer: Medicare PPO

## 2016-07-11 ENCOUNTER — Ambulatory Visit (HOSPITAL_BASED_OUTPATIENT_CLINIC_OR_DEPARTMENT_OTHER): Payer: Medicare PPO | Admitting: Oncology

## 2016-07-11 ENCOUNTER — Other Ambulatory Visit (HOSPITAL_BASED_OUTPATIENT_CLINIC_OR_DEPARTMENT_OTHER): Payer: Medicare PPO

## 2016-07-11 VITALS — BP 150/80 | HR 89 | Temp 98.0°F | Resp 18 | Wt 189.6 lb

## 2016-07-11 DIAGNOSIS — Z95828 Presence of other vascular implants and grafts: Secondary | ICD-10-CM

## 2016-07-11 DIAGNOSIS — Z452 Encounter for adjustment and management of vascular access device: Secondary | ICD-10-CM

## 2016-07-11 DIAGNOSIS — C801 Malignant (primary) neoplasm, unspecified: Secondary | ICD-10-CM

## 2016-07-11 DIAGNOSIS — Z9012 Acquired absence of left breast and nipple: Secondary | ICD-10-CM

## 2016-07-11 DIAGNOSIS — R945 Abnormal results of liver function studies: Secondary | ICD-10-CM

## 2016-07-11 DIAGNOSIS — M549 Dorsalgia, unspecified: Secondary | ICD-10-CM

## 2016-07-11 DIAGNOSIS — D61810 Antineoplastic chemotherapy induced pancytopenia: Secondary | ICD-10-CM

## 2016-07-11 DIAGNOSIS — M79601 Pain in right arm: Secondary | ICD-10-CM | POA: Diagnosis not present

## 2016-07-11 DIAGNOSIS — R7989 Other specified abnormal findings of blood chemistry: Secondary | ICD-10-CM

## 2016-07-11 DIAGNOSIS — I10 Essential (primary) hypertension: Secondary | ICD-10-CM

## 2016-07-11 DIAGNOSIS — C50912 Malignant neoplasm of unspecified site of left female breast: Secondary | ICD-10-CM

## 2016-07-11 DIAGNOSIS — D630 Anemia in neoplastic disease: Secondary | ICD-10-CM

## 2016-07-11 LAB — CBC WITH DIFFERENTIAL/PLATELET
BASO%: 0.5 % (ref 0.0–2.0)
BASOS ABS: 0 10*3/uL (ref 0.0–0.1)
EOS%: 1.5 % (ref 0.0–7.0)
Eosinophils Absolute: 0.1 10*3/uL (ref 0.0–0.5)
HEMATOCRIT: 24.3 % — AB (ref 34.8–46.6)
HGB: 8.1 g/dL — ABNORMAL LOW (ref 11.6–15.9)
LYMPH#: 0.2 10*3/uL — AB (ref 0.9–3.3)
LYMPH%: 5.2 % — ABNORMAL LOW (ref 14.0–49.7)
MCH: 27 pg (ref 25.1–34.0)
MCHC: 33.2 g/dL (ref 31.5–36.0)
MCV: 81.5 fL (ref 79.5–101.0)
MONO#: 0.4 10*3/uL (ref 0.1–0.9)
MONO%: 10 % (ref 0.0–14.0)
NEUT#: 3.3 10*3/uL (ref 1.5–6.5)
NEUT%: 82.8 % — ABNORMAL HIGH (ref 38.4–76.8)
Platelets: 197 10*3/uL (ref 145–400)
RBC: 2.99 10*6/uL — ABNORMAL LOW (ref 3.70–5.45)
RDW: 15.4 % — ABNORMAL HIGH (ref 11.2–14.5)
WBC: 4 10*3/uL (ref 3.9–10.3)

## 2016-07-11 LAB — COMPREHENSIVE METABOLIC PANEL
ALBUMIN: 2.4 g/dL — AB (ref 3.5–5.0)
ALK PHOS: 365 U/L — AB (ref 40–150)
ALT: 53 U/L (ref 0–55)
AST: 73 U/L — AB (ref 5–34)
Anion Gap: 12 mEq/L — ABNORMAL HIGH (ref 3–11)
BUN: 18.9 mg/dL (ref 7.0–26.0)
CALCIUM: 11.7 mg/dL — AB (ref 8.4–10.4)
CO2: 24 mEq/L (ref 22–29)
CREATININE: 1 mg/dL (ref 0.6–1.1)
Chloride: 109 mEq/L (ref 98–109)
EGFR: 70 mL/min/{1.73_m2} — ABNORMAL LOW (ref >90)
Glucose: 105 mg/dl (ref 70–140)
Potassium: 4.1 mEq/L (ref 3.5–5.1)
Sodium: 145 mEq/L (ref 136–145)
TOTAL PROTEIN: 7.3 g/dL (ref 6.4–8.3)
Total Bilirubin: 0.84 mg/dL (ref 0.20–1.20)

## 2016-07-11 LAB — TECHNOLOGIST REVIEW: Technologist Review: 1

## 2016-07-11 MED ORDER — SODIUM CHLORIDE 0.9 % IJ SOLN
10.0000 mL | INTRAMUSCULAR | Status: DC | PRN
  Administered 2016-07-11: 10 mL via INTRAVENOUS
  Filled 2016-07-11: qty 10

## 2016-07-11 MED ORDER — HEPARIN SOD (PORK) LOCK FLUSH 100 UNIT/ML IV SOLN
500.0000 [IU] | Freq: Once | INTRAVENOUS | Status: AC | PRN
  Administered 2016-07-11: 500 [IU] via INTRAVENOUS
  Filled 2016-07-11: qty 5

## 2016-07-11 NOTE — Progress Notes (Signed)
OFFICE PROGRESS NOTE   July 11, 2016   Physicians:  Jacquiline Doe, Boris,Nigel Buist, Altamese Dilling DeMason,Thomas Cornett, John Moody/ Tim Zagar (rad onc Silverio Lay)  INTERVAL HISTORY:   Patient is seen, together with husband, with complaints of new pain back and extremities for "a few weeks". 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 She had 5 cycles of neoadjuvant carbo VP16 thru 02-09-16, mastectomy with sentinel nodes 03-15-16, then delay in starting radiation due to wound healing problems. Last imaging was PET 01-25-16.   Patient reports a few weeks of pain right shoulder, RUE, low back, some in LE. Symptoms are aggravated by positioning on radiation table, not relieved with XS tylenol, did improve with one 5 mg oxycodone and also improved with one Aleve this AM. She is fatigued in afternoons after each radiaition treatment, but denies SOB with exertion, other chest pain, any bleeding. No fever or symptoms of infection including any flu/ viral symptoms. Appetite fairly good, no N/V, no abdominal pain, bowels ok. No LE swelling. No problems with PAC.Still some thick eschar at mastectomy site, improving, and almost no skin reaction in radiation portal. No pain left shoulder. "drinking lots of water" but does not mention thirst.  Remainder of 14 point Review of Systems negative.   PAC replaced by IR on 12-30-15, flushed 07-11-16 Flu vaccine 04-04-16  ONCOLOGIC HISTORY Patient had not had regular medical care in a number of years when she noticed 2 masses in central left breast in Dec 2016. She was seen by Dr [name] in [place], with bilateral mammograms done 09-02-15 at [facility]. The mammograms, with no priors for comparison, showed in the left breast a dense irregular mass at least 3 cm in greatest dimension, with pleomorphic  calcifications extending 5 cm inferomedially in left breast and with associated nipple inversion and retraction; also in the left breast was a 10 mm irregular mass at 9-10 o'clock. In the right breast an irregular mass centrally slightly medial to nipple 1.3 cm diameter. [place] on left breast showed the mass at 12 o'clock measured 2.8 x 2.4 x 2.5 cm with internal vascularity, and additional hypoechoic mass at 9 o'clock 0.8 x 0.4 x 0.6 cm. In right breast by [place] no suspicious solid or cystic mass identified. 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 Morphologically the neuroendocrine carcinoma is reported to show small to intermediate sized cells with high mitotic count and prominent necrosis, strongly positive for pancytokeratin and negative for chromogranin A, positive for synaptophysin, strongly positive for TTF-1, ER 1%, PR 105, Ki-67 of 96%. She subsequently had biopsy of the right breast lesion ([id number]) at [facility], no malignancy.  Staging studies done as follows:  Bone scan [facility] 10-05-15 with bilateral anterior lower chest activity not clearly malignant, symmetrical renal activity, osteoarthritis left knee. CT CAP at [facility] 09-30-15 : Chest no hilar or mediastinal adenopathy, thyroid normal, no pericardial effusion, no pulmonary lesions or pleural effusion, no lytic or blastic skeletal lesions. Abdomen numerous hepatic cysts without evidence of hepatic mets, adrenals mildly nodular, numerous renal cysts without hydronephrosis, spleen without focal lesions, pancreas normal, bowels unremarkable other than diverticulosis descending and sigmoid. No adenopathy. Pelvis  no adenopathy, uterine fibroids, endometrium thickened at 23 mm , no ascites, bladder normal . PET [facility] Sidle 7-49-44 no hypermetabolic hilar or Mediastinal nodes, no pulmonary uptake, left breast mass with SUV 22.89 measuring 3.3 cm without uptake in axillary, retropectoral or supraclavicular nodes. An area of uptake anterior spleen with SUV max 5.63 corresponding to low density structure not present on 09-30-15, no liver concerns, no other uptake abdomen or pelvis. No mention of endometrial lining. Diffuse heterogeneous uptake thru axial and appendicular skeleton which would be consistent either with diffuse marrow/ bone involvement or anemia. Dr Jacquiline Doe discussed with IR, decision made not to attempt biopsy of spleen.  Dr Jacquiline Doe discussed case with breast oncologist Dr Myra Gianotti Muss and Dr L.Curey. Recommendation was for neoadjuvant carbo VP16 x 4 cycles with consideration of additional anthracyclines, then consideration of mastectomy with axillary node evaluation and further management depending on surgical findings.  Note patient refused MRI brain during initial staging.  Patient received cycle 1 carbo VP16 ~ May 1-2-3 in [place], those chemo records not included in outside information. She received neulasta, however developed profound neutropenia without febrile complications, thrombocytopenia and anemia (hgb 7.9) for which she was transfused 2 units PRBCs. Cycle 2 was given 5-22,23,24, reportedly dose reduced from cycle 1. Cycle 2 carbo dose total was 445.5 mg and VP16 118 mg, with decadron 12 mg and Aloxi 0.25 mg. VP16 doses on 5-23 and 5-24 were each 118 mg total , with decadron 12 mg as only antiemetic. Family tells me that she had neulasta also after cycle 2, that information not in these outside records.  Labs 11-16-15 WBC 7.2, ANC 4.8, Hgb 10.2, plt 202k Chemistries date not clear to me (listed as "2 weeks ago") creat 1.01, BUN 15, calcium 9.9, Tprot 6.4, alb 3.65, AP 90, AST 18, Tbili 0.7, ALT 15, Na 145, k 4.3, Cl 111, glu 94  PerDr Darovsky, the left breast mass hadimproved only slightly with  chemo given thru cycle 2. Cycle 3 carbo VP16 given at [facility] 6-19 thru 12-16-15, with neulasta continued. Due to concerns that the breast mass was not responding as expected, she had restaging CT CAP on 01-01-16 ,which showed breast mass 2.8 x 2.6 cm as compared with 3.3 x 2.6 cm on imaging 09-2015, stable area in spleen. Outside pathology was reviewed by [facility] pathologist, agreed with previous diagnosis. Cycle 4 Carbo VP16 was given 7-17 thru 01-13-16.Follow up PET after cycle 4, on 01-25-16 showed uptake in left breast mass SUV 17.4 compared with 22.9 previously, no hypermetabolic axillary or mediastinal nodes, stable uptake 5.5 in spleen with calcifications there identified on noncontrast images suggesting benign, diffuse marrow activity consistent with anemia and gCSF, probable gastritis. Cycle 5 was given days 1 and 2 only due to concern for cytopenias and timing of upcoming surgery. She had left mastectomy with sentinel axillary lymph node evaluation by Dr Brantley Stage on 03-15-16. Pathology [phone]) from 03-15-16 left mastectomy poorly differentiated neuroendocrine carcinoma grade 3, size 4.5 cm, margins and skin/ nipple/ skeletal muscle not involved with closest margin posterior at 3 mm, 3 sentinel nodes negative. Review of path confirms small cell features and LVSI present. Start of radiation was delayed with wound healing, begun early Dec 2018 and "11 treatments left" as of 07-11-16.   Objective:  Vital signs in last 24 hours:  BP (!) 150/80 (BP Location: Left Arm, Patient Position: Sitting)   Pulse 89   Temp 98 F (36.7 C) (Oral)   Resp 18  Wt 189 lb 9.6 oz (86 kg)   SpO2 100%   BMI 33.59 kg/m  Weight stable from Nov. Alert, oriented and appropriate, very pleasant as always. Ambulatory slowly without assistance. Pale, not icteric. Respirations not labored in exam room. No cough.   HEENT:PERRL, sclerae not icteric. Oral mucosa moist without lesions, very poor dentition as previously,   posterior pharynx clear.  Neck supple. No JVD.  Lymphatics:no cervical,supraclavicular, axillary adenopathy Resp: clear to auscultation bilaterally and normal percussion bilaterally Cardio: regular rate and rhythm. No gallop. GI: soft, nontender, not distended,cannot tell liver edge but seems full RUQ. Normally active bowel sounds.  Musculoskeletal/ Extremities:UE without swelling,  LE  without pitting edema, cords, tenderness. Good ROM at shoulders, which is not painful Neuro: Speech fluent and appropriate, moves extremities equally, no focal deficits Skin without rash, ecchymosis, petechiae Left mastectomy incision closed medial and lateral to ~ 4 x 5 cm thick dark eschar. No significant skin reaction in radiation portal. No exam findings for local recurrence there.  Portacath-without erythema or tenderness  Lab Results:  Results for orders placed or performed in visit on 07/11/16  TECHNOLOGIST REVIEW  Result Value Ref Range   Technologist Review 1% Nrbc, Rare myelocyte, sl poly, Few large plts   CBC with Differential  Result Value Ref Range   WBC 4.0 3.9 - 10.3 10e3/uL   NEUT# 3.3 1.5 - 6.5 10e3/uL   HGB 8.1 Verified by recollection  (L) 11.6 - 15.9 g/dL   HCT 24.3 (L) 34.8 - 46.6 %   Platelets 197 145 - 400 10e3/uL   MCV 81.5 79.5 - 101.0 fL   MCH 27.0 25.1 - 34.0 pg   MCHC 33.2 31.5 - 36.0 g/dL   RBC 2.99 (L) 3.70 - 5.45 10e6/uL   RDW 15.4 (H) 11.2 - 14.5 %   lymph# 0.2 (L) 0.9 - 3.3 10e3/uL   MONO# 0.4 0.1 - 0.9 10e3/uL   Eosinophils Absolute 0.1 0.0 - 0.5 10e3/uL   Basophils Absolute 0.0 0.0 - 0.1 10e3/uL   NEUT% 82.8 (H) 38.4 - 76.8 %   LYMPH% 5.2 (L) 14.0 - 49.7 %   MONO% 10.0 0.0 - 14.0 %   EOS% 1.5 0.0 - 7.0 %   BASO% 0.5 0.0 - 2.0 %  Comprehensive metabolic panel  Result Value Ref Range   Sodium 145 136 - 145 mEq/L   Potassium 4.1 3.5 - 5.1 mEq/L   Chloride 109 98 - 109 mEq/L   CO2 24 22 - 29 mEq/L   Glucose 105 70 - 140 mg/dl   BUN 18.9 7.0 - 26.0 mg/dL    Creatinine 1.0 0.6 - 1.1 mg/dL   Total Bilirubin 0.84 0.20 - 1.20 mg/dL   Alkaline Phosphatase 365 (H) 40 - 150 U/L   AST 73 (H) 5 - 34 U/L   ALT 53 0 - 55 U/L   Total Protein 7.3 6.4 - 8.3 g/dL   Albumin 2.4 (L) 3.5 - 5.0 g/dL   Calcium 11.7 (H) 8.4 - 10.4 mg/dL   Anion Gap 12 (H) 3 - 11 mEq/L   EGFR 70 (L) >90 ml/min/1.73 m2  Hemoglobin was 11.2 on 05-16-16, with ANC 2.9 and plt 106. Initial draw from [facility] was 7.7 today, above was redraw to confirm  Tech review after visit noted, with 1% NRBC and rare myelocyte Last chemistries 05-16-16 AP 86, AST 10, ALT 6, calcium stable at 10.6 with alb 3.3  Studies/Results: Have ordered CT CAP for this week. Have requested PET  as soon as possible Message sent to managed care for preauth   Medications: I have reviewed the patient's current medications. She has oxycodone 5 mg and naproxen available. She can increase laxatives with oxycodone as needed  DISCUSSION I have told patient and husband that I am concerned that pain, significant anemia, increased bone and liver chemistries are likely related to progression of the small cell cancer. She is very reluctant to consider this, but understands that we need to evaluate further in order to take care of her. I will request scans/ preauth/ speak with Dr Lianne Cure (1-16, as that office closed by time of this late afternoon visit today). Likely will need PRBCs and zometa this week. I will see her back also later this week.  I mentioned holding radiation if unable to tolerate or if other treatment needed. OK to use naproxen/ NSAID with food and plenty of fluids, or oxycodone with increased laxatives if needed now.   She would prefer medical oncology care in [place], however this is not presently available at [facility]. She requests continuing medical oncology care in [place] for now.  [facility] information  ([facility] rad onc) not in [place], will request.  b Assessment/Plan:  1.  Small cell carcinoma left breast 08-2015: history as above, now increasing discomfort right shoulder, RUE, low back and LE,  with progressive anemia, elevated AP and increasing AST/ ALT, and hypercalcemia. PET would be most helpful, however likely cannot get this quickly, so have ordered CT CAP this week and requested PET to follow. May need bone marrow exam, imaging head. Likely needs PRBCs, IVF, bisphosphonate this week.  I will speak with Dr Lianne Cure in AM. Note she had only partial response in breast to [place] VP16 initially.  Prn pain medication, push po fluids. 2.cytopenias with chemo requiring gCSF and transfusions. She has not had bone marrow exam, will address. She does not tolerate scans or invasive procedures, will need sedation if so.  3.hypercalcemia: previously some variable low elevation. Needs PTH with next blood draw. Have requested zometa later this week. Push po fluids. 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 HTN untreated at presentation. Still does not have PCP 6.gastritis symptoms resolved with protonix 7.Flu vaccine 04-04-16 8.very poor dentition: has declined referral to dental medicine. Will add peridex. 9. Osteoarthritis left knee. Tho could have other arthritis aggravated by positioning/ RT table, with other findings this does not seem as likely for pain 10. PAC in , flushed today 11.FH breast cancer daughter, pancreatic in father, "bone cancer" mother and prostate in brother.  11. No advance directives  All questions answered and we will be in touch with additional scheduling this week; they know to call if needed prior to next appointments. Imaging orders placed, message to managed care. Time spent 45 min including >50% counseling and coordination of care. Cc Dr Lianne Cure, Dr Brantley Stage   Evlyn Clines, MD   07/11/2016, 8:45 PM

## 2016-07-12 ENCOUNTER — Telehealth: Payer: Self-pay | Admitting: Oncology

## 2016-07-12 ENCOUNTER — Telehealth: Payer: Self-pay

## 2016-07-12 ENCOUNTER — Other Ambulatory Visit: Payer: Self-pay | Admitting: Oncology

## 2016-07-12 DIAGNOSIS — R7989 Other specified abnormal findings of blood chemistry: Secondary | ICD-10-CM | POA: Insufficient documentation

## 2016-07-12 DIAGNOSIS — C801 Malignant (primary) neoplasm, unspecified: Secondary | ICD-10-CM

## 2016-07-12 DIAGNOSIS — D630 Anemia in neoplastic disease: Secondary | ICD-10-CM

## 2016-07-12 DIAGNOSIS — D649 Anemia, unspecified: Secondary | ICD-10-CM

## 2016-07-12 DIAGNOSIS — D6481 Anemia due to antineoplastic chemotherapy: Secondary | ICD-10-CM

## 2016-07-12 DIAGNOSIS — T451X5A Adverse effect of antineoplastic and immunosuppressive drugs, initial encounter: Secondary | ICD-10-CM

## 2016-07-12 DIAGNOSIS — K089 Disorder of teeth and supporting structures, unspecified: Secondary | ICD-10-CM

## 2016-07-12 DIAGNOSIS — R945 Abnormal results of liver function studies: Secondary | ICD-10-CM

## 2016-07-12 MED ORDER — CHLORHEXIDINE GLUCONATE 0.12 % MT SOLN: 15.0000 mL | Freq: Two times a day (BID) | OROMUCOSAL | 1 refills | Status: AC

## 2016-07-12 NOTE — Telephone Encounter (Signed)
PET and bone marrow by IR  Received: Today  Message Contents  [name], MD  Baruch Merl, RN; Janace Hoard, RN        See previous message also. Let patient / husband know:   First available PET is 1-29 at [facility] arrive in radiology 1230 for 1:00 PET. NPO x 6 hrs prior.   Bone marrow by IR Wed 1-24 arrive [facility] radiology 8:45 for 1100 procedure. NPO except BP meds after MN. They will give her sedation, so she does not need additional meds for anxiety prior.   I will put in phone note with all of these apts, please fax it to Dr Lianne Cure at [facility] [phone]   thanks

## 2016-07-12 NOTE — Telephone Encounter (Deleted)
w by IR  Received: Today  Message Contents  [name], MD  Barbaraann Share

## 2016-07-12 NOTE — Telephone Encounter (Signed)
Medical Oncology  Phone calls from MD to [facility] Radiology Scheduling, IR and nuclear med scheduling. Have set up:  CT CAP [facility] Sidle 07-13-16 arrive 1130 to drink contrast at radiology, CTs at 1:30. NPO x 4 hrs prior. Bone marrow by IR aspirate and biopsy 1-24 Elv[facility] Sidle arrive 0845 for 1100 appointment. NPO except BP meds after MN. Will have sedation. PET next available [facility] 1-29 arrive 1230 for 1:00 appointment, NPO x 6 hrs prior.  Messages to [facility] managed care for preauth. Messages to collaborative RN to communicate with patient and radiation oncology in [place].  Godfrey Pick, MD

## 2016-07-12 NOTE — Telephone Encounter (Signed)
-----   Message from [name], MD sent at 07/12/2016 10:18 AM EST ----- Regarding: scans, MAR, script etc. Please fax my note 07-11-16 to attn Dr Jaye Beagle at [facility] radiation oncology [phone]. Note is ready. Also fax cmet and cbc from 1-15 separately  Tell patient that I spoke with Dr Lianne Cure. If conflicts with other appointmets with RT times or if she is too uncomfortable or too weak, will hold radiation.   Please let patient/ husband know that CT CAP can be done at [facility] tomorrow 1-17 arrive 1130 to drink contrast there, scan at 1:30. NPO for solids x 4 hrs prior.   Please let her know that I would like labs also at [facility] on 1-17, before or after CT:  Type and cross for 2 units PRBCs to be given at least on Fri 1-17, haptoglobin, PTH  Please put in [facility] for transfusion, let me know and I will put in PRBC orders.   Tell patient that I want to set up bone marrow test using sedation, by IR, to get more information about why blood counts are lower. They can use PAC for the sedation and will use numbing medicine, so she will be well taken care of, and this information will help [place].  I will put in IR order.   Please call in Peridex mouthwash to use bid, as poor dentition and this will help some.  Thank you

## 2016-07-12 NOTE — Telephone Encounter (Signed)
Medical Oncology  Notified by RN that patient may not be able to come for CT on 07-13-16. MD spoke with husband first, then with husband and patient together. I have encouraged her to get CT on 07-13-16 due to concerns from visit and labs 07-11-16.   She will be able to come to [facility] radiology at 1130 for 1330 CT on 07-13-16.   MD can write work notes for family members if needed. Told her that I spoke with Dr Lianne Cure.   Patient expressed appreciation for our efforts.   Godfrey Pick, MD

## 2016-07-12 NOTE — Telephone Encounter (Signed)
one more message  Received: Today  Message Contents  [name], MD  Baruch Merl, RN; Janace Hoard, RN        Please also fax [facility] schedule to Dr Lianne Cure   Also I am to see her on Fri 1-19 when [facility] opens the day "per MD only" and schedulers put her on   thanks

## 2016-07-12 NOTE — Telephone Encounter (Signed)
Dr. Marko Plume had to speak with Ms Groenke regaring importance of CT Scan and labs to be done tomorrow as she stated that she did not have a ride. Pt agreeable to tomorrows schedule ater converstion with Dr. Marko Plume per Dr. Marko Plume.

## 2016-07-12 NOTE — Telephone Encounter (Signed)
Medical Oncology  This MD spoke directly with Dr Lianne Cure [facility] [phone] at 0900 today, to let him know concerns from patient's visit at [facility] on 07-11-16, and plans for further evaluation of possible progression of small cell ca.   Dr Derrill Memo preference is to continue radiation to left mastectomy area until diagnosis clarified, as long as patient tolerates treatments.   Will fax my note 07-11-16 with other information to Dr Lianne Cure [phone]  L.Marko Plume MD

## 2016-07-12 NOTE — Telephone Encounter (Signed)
Gave Ms Cozzens all the appointment information as noted below by Dr. Marko Plume Pt to come to [facility] at 1215 tomorrow for lab/PAC access and then go back to CT for scan.  Told Ms Campus that the Peridex will be sent to [facility] in [place].  Elmyra Ricks with [facility] prior authorization said that Pt 's insurance will only prior authorize the CT scan or PET.  Told Nichole that Dr. Marko Plume will take the CT scan to start.  Dr. Marko Plume  Aware that the PET will need Peer to Peer Review to be authorized. All requested information to be faxed to Dr. Lianne Cure is doen.

## 2016-07-12 NOTE — Telephone Encounter (Signed)
sw pt to confirm 1/19 appt date/times per LOS

## 2016-07-13 ENCOUNTER — Ambulatory Visit (HOSPITAL_COMMUNITY): Payer: Medicare PPO

## 2016-07-13 ENCOUNTER — Ambulatory Visit (HOSPITAL_COMMUNITY)
Admission: RE | Admit: 2016-07-13 | Discharge: 2016-07-13 | Disposition: A | Discharge status: Home / Self Care | Payer: Medicare PPO | Source: Ambulatory Visit | Attending: Oncology | Admitting: Oncology

## 2016-07-13 ENCOUNTER — Telehealth: Payer: Self-pay | Admitting: Oncology

## 2016-07-13 ENCOUNTER — Other Ambulatory Visit: Payer: Medicare PPO

## 2016-07-13 DIAGNOSIS — D649 Anemia, unspecified: Secondary | ICD-10-CM

## 2016-07-13 NOTE — Telephone Encounter (Signed)
Patient called to cancel appointment due to inclement weather she needs to reschedule Lab and infusion.  Her call back number is [phone]

## 2016-07-13 NOTE — Telephone Encounter (Signed)
Returned patient call re rescheduling infusion appointment. Spoke with patient and per patient she does not wish to cancel her infusion appointment, which is on Friday 1/19. Per patient she called to cancel lab/flush/ct for today. Lab/flush cancelled - patient will reschedule ct. Message to desk nurse.

## 2016-07-15 ENCOUNTER — Ambulatory Visit (HOSPITAL_BASED_OUTPATIENT_CLINIC_OR_DEPARTMENT_OTHER): Payer: Medicare PPO | Admitting: Oncology

## 2016-07-15 ENCOUNTER — Ambulatory Visit (HOSPITAL_BASED_OUTPATIENT_CLINIC_OR_DEPARTMENT_OTHER): Payer: Medicare PPO

## 2016-07-15 ENCOUNTER — Ambulatory Visit: Payer: Medicare PPO

## 2016-07-15 ENCOUNTER — Other Ambulatory Visit (HOSPITAL_BASED_OUTPATIENT_CLINIC_OR_DEPARTMENT_OTHER): Payer: Medicare PPO

## 2016-07-15 ENCOUNTER — Telehealth: Payer: Self-pay

## 2016-07-15 ENCOUNTER — Encounter: Payer: Self-pay | Admitting: Oncology

## 2016-07-15 ENCOUNTER — Other Ambulatory Visit: Payer: Self-pay | Admitting: Oncology

## 2016-07-15 VITALS — BP 154/77 | HR 95 | Temp 99.3°F | Resp 16

## 2016-07-15 VITALS — BP 167/81 | HR 113 | Temp 98.3°F | Resp 18 | Ht 63.0 in | Wt 186.1 lb

## 2016-07-15 DIAGNOSIS — C7A1 Malignant poorly differentiated neuroendocrine tumors: Secondary | ICD-10-CM

## 2016-07-15 DIAGNOSIS — Z95828 Presence of other vascular implants and grafts: Secondary | ICD-10-CM

## 2016-07-15 DIAGNOSIS — I10 Essential (primary) hypertension: Secondary | ICD-10-CM | POA: Diagnosis not present

## 2016-07-15 DIAGNOSIS — R945 Abnormal results of liver function studies: Secondary | ICD-10-CM

## 2016-07-15 DIAGNOSIS — C801 Malignant (primary) neoplasm, unspecified: Secondary | ICD-10-CM

## 2016-07-15 DIAGNOSIS — T148XXD Other injury of unspecified body region, subsequent encounter: Secondary | ICD-10-CM

## 2016-07-15 DIAGNOSIS — Z9012 Acquired absence of left breast and nipple: Secondary | ICD-10-CM

## 2016-07-15 DIAGNOSIS — D649 Anemia, unspecified: Secondary | ICD-10-CM

## 2016-07-15 DIAGNOSIS — R7989 Other specified abnormal findings of blood chemistry: Secondary | ICD-10-CM

## 2016-07-15 DIAGNOSIS — D630 Anemia in neoplastic disease: Secondary | ICD-10-CM

## 2016-07-15 LAB — CBC & DIFF AND RETIC
BASO%: 0 % (ref 0.0–2.0)
Basophils Absolute: 0 10*3/uL (ref 0.0–0.1)
EOS%: 0.2 % (ref 0.0–7.0)
Eosinophils Absolute: 0 10*3/uL (ref 0.0–0.5)
HCT: 22.9 % — ABNORMAL LOW (ref 34.8–46.6)
HGB: 7.3 g/dL — ABNORMAL LOW (ref 11.6–15.9)
Immature Retic Fract: 16.4 % — ABNORMAL HIGH (ref 1.60–10.00)
LYMPH%: 8.4 % — AB (ref 14.0–49.7)
MCH: 26.1 pg (ref 25.1–34.0)
MCHC: 31.9 g/dL (ref 31.5–36.0)
MCV: 81.8 fL (ref 79.5–101.0)
MONO#: 0.5 10*3/uL (ref 0.1–0.9)
MONO%: 8.9 % (ref 0.0–14.0)
NEUT%: 82.5 % — ABNORMAL HIGH (ref 38.4–76.8)
NEUTROS ABS: 4.5 10*3/uL (ref 1.5–6.5)
Platelets: 142 10*3/uL — ABNORMAL LOW (ref 145–400)
RBC: 2.8 10*6/uL — AB (ref 3.70–5.45)
RDW: 15.3 % — ABNORMAL HIGH (ref 11.2–14.5)
Retic %: 2.48 % — ABNORMAL HIGH (ref 0.70–2.10)
Retic Ct Abs: 69.44 10*3/uL (ref 33.70–90.70)
WBC: 5.5 10*3/uL (ref 3.9–10.3)
lymph#: 0.5 10*3/uL — ABNORMAL LOW (ref 0.9–3.3)

## 2016-07-15 LAB — COMPREHENSIVE METABOLIC PANEL
ALBUMIN: 2.4 g/dL — AB (ref 3.5–5.0)
ALK PHOS: 214 U/L — AB (ref 40–150)
ALT: 20 U/L (ref 0–55)
AST: 22 U/L (ref 5–34)
Anion Gap: 10 mEq/L (ref 3–11)
BILIRUBIN TOTAL: 0.96 mg/dL (ref 0.20–1.20)
BUN: 13.4 mg/dL (ref 7.0–26.0)
CALCIUM: 11.4 mg/dL — AB (ref 8.4–10.4)
CO2: 23 mEq/L (ref 22–29)
CREATININE: 1 mg/dL (ref 0.6–1.1)
Chloride: 108 mEq/L (ref 98–109)
EGFR: 70 mL/min/{1.73_m2} — ABNORMAL LOW (ref >90)
Glucose: 114 mg/dl (ref 70–140)
Potassium: 3.8 mEq/L (ref 3.5–5.1)
Sodium: 142 mEq/L (ref 136–145)
Total Protein: 7.1 g/dL (ref 6.4–8.3)

## 2016-07-15 LAB — TECHNOLOGIST REVIEW

## 2016-07-15 LAB — PREPARE RBC (CROSSMATCH)

## 2016-07-15 MED ORDER — SODIUM CHLORIDE 0.9 % IV SOLN
750.0000 mL | INTRAVENOUS | Status: DC
  Administered 2016-07-15: 10:00:00 via INTRAVENOUS

## 2016-07-15 MED ORDER — ACETAMINOPHEN 325 MG PO TABS
ORAL_TABLET | ORAL | Status: AC
  Filled 2016-07-15: qty 2

## 2016-07-15 MED ORDER — OXYCODONE HCL 5 MG PO TABS
5.0000 mg | ORAL_TABLET | Freq: Four times a day (QID) | ORAL | 0 refills | Status: DC | PRN
Start: 2016-07-15 — End: 2016-08-08

## 2016-07-15 MED ORDER — ACETAMINOPHEN 325 MG PO TABS
650.0000 mg | ORAL_TABLET | Freq: Once | ORAL | Status: AC
Start: 2016-07-15 — End: 2016-07-15
  Administered 2016-07-15: 650 mg via ORAL

## 2016-07-15 MED ORDER — SODIUM CHLORIDE 0.9 % IJ SOLN
10.0000 mL | INTRAMUSCULAR | Status: DC | PRN
  Filled 2016-07-15: qty 10

## 2016-07-15 MED ORDER — HEPARIN SOD (PORK) LOCK FLUSH 100 UNIT/ML IV SOLN
250.0000 [IU] | INTRAVENOUS | Status: DC | PRN
  Filled 2016-07-15: qty 5

## 2016-07-15 MED ORDER — SODIUM CHLORIDE 0.9 % IV SOLN
250.0000 mL | Freq: Once | INTRAVENOUS | Status: AC
  Administered 2016-07-15: 250 mL via INTRAVENOUS

## 2016-07-15 MED ORDER — SODIUM CHLORIDE 0.9% FLUSH
10.0000 mL | INTRAVENOUS | Status: AC | PRN
  Administered 2016-07-15: 10 mL
  Filled 2016-07-15: qty 10

## 2016-07-15 MED ORDER — DIPHENHYDRAMINE HCL 25 MG PO CAPS
ORAL_CAPSULE | ORAL | Status: AC
  Filled 2016-07-15: qty 2

## 2016-07-15 MED ORDER — SODIUM CHLORIDE 0.9 % IJ SOLN
10.0000 mL | INTRAMUSCULAR | Status: DC | PRN
  Administered 2016-07-15: 10 mL via INTRAVENOUS
  Filled 2016-07-15: qty 10

## 2016-07-15 MED ORDER — HEPARIN SOD (PORK) LOCK FLUSH 100 UNIT/ML IV SOLN
500.0000 [IU] | Freq: Once | INTRAVENOUS | Status: AC | PRN
  Administered 2016-07-15: 500 [IU] via INTRAVENOUS
  Filled 2016-07-15: qty 5

## 2016-07-15 MED ORDER — ZOLEDRONIC ACID 4 MG/100ML IV SOLN
4.0000 mg | Freq: Once | INTRAVENOUS | Status: AC
  Administered 2016-07-15: 4 mg via INTRAVENOUS
  Filled 2016-07-15: qty 100

## 2016-07-15 NOTE — Patient Instructions (Signed)
Blood Transfusion, Care After This sheet gives you information about how to care for yourself after your procedure. Your doctor may also give you more specific instructions. If you have problems or questions, contact your doctor. Follow these instructions at home:  Take over-the-counter and prescription medicines only as told by your doctor.  Go back to your normal activities as told by your doctor.  Follow instructions from your doctor about how to take care of the area where an IV tube was put into your vein (insertion site). Make sure you:  Wash your hands with soap and water before you change your bandage (dressing). If there is no soap and water, use hand sanitizer.  Change your bandage as told by your doctor.  Check your IV insertion site every day for signs of infection. Check for:  More redness, swelling, or pain.  More fluid or blood.  Warmth.  Pus or a bad smell. Contact a doctor if:  You have more redness, swelling, or pain around the IV insertion site..  You have more fluid or blood coming from the IV insertion site.  Your IV insertion site feels warm to the touch.  You have pus or a bad smell coming from the IV insertion site.  Your pee (urine) turns pink, red, or brown.  You feel weak after doing your normal activities. Get help right away if:  You have signs of a serious allergic or body defense (immune) system reaction, including:  Itchiness.  Hives.  Trouble breathing.  Anxiety.  Pain in your chest or lower back.  Fever, flushing, and chills.  Fast pulse.  Rash.  Watery poop (diarrhea).  Throwing up (vomiting).  Dark pee.  Serious headache.  Dizziness.  Stiff neck.  Yellow color in your face or the white parts of your eyes (jaundice). Summary  After a blood transfusion, return to your normal activities as told by your doctor.  Every day, check for signs of infection where the IV tube was put into your vein.  Some signs of  infection are warm skin, more redness and pain, more fluid or blood, and pus or a bad smell where the needle went in.  Contact your doctor if you feel weak or have any unusual symptoms. This information is not intended to replace advice given to you by your health care provider. Make sure you discuss any questions you have with your health care provider. Document Released: 07/04/2014 Document Revised: 02/05/2016 Document Reviewed: 02/05/2016 Elsevier Interactive Patient Education  2017 Elsevier Inc.    Zoledronic Acid injection (Hypercalcemia, Oncology) What is this medicine? ZOLEDRONIC ACID (ZOE le dron ik AS id) lowers the amount of calcium loss from bone. It is used to treat too much calcium in your blood from cancer. It is also used to prevent complications of cancer that has spread to the bone. This medicine may be used for other purposes; ask your health care provider or pharmacist if you have questions. COMMON BRAND NAME(S): Zometa What should I tell my health care provider before I take this medicine? They need to know if you have any of these conditions: -aspirin-sensitive asthma -cancer, especially if you are receiving medicines used to treat cancer -dental disease or wear dentures -infection -kidney disease -receiving corticosteroids like dexamethasone or prednisone -an unusual or allergic reaction to zoledronic acid, other medicines, foods, dyes, or preservatives -pregnant or trying to get pregnant -breast-feeding How should I use this medicine? This medicine is for infusion into a vein. It is given by  a health care professional in a hospital or clinic setting. Talk to your pediatrician regarding the use of this medicine in children. Special care may be needed. Overdosage: If you think you have taken too much of this medicine contact a poison control center or emergency room at once. NOTE: This medicine is only for you. Do not share this medicine with others. What if I miss  a dose? It is important not to miss your dose. Call your doctor or health care professional if you are unable to keep an appointment. What may interact with this medicine? -certain antibiotics given by injection -NSAIDs, medicines for pain and inflammation, like ibuprofen or naproxen -some diuretics like bumetanide, furosemide -teriparatide -thalidomide This list may not describe all possible interactions. Give your health care provider a list of all the medicines, herbs, non-prescription drugs, or dietary supplements you use. Also tell them if you smoke, drink alcohol, or use illegal drugs. Some items may interact with your medicine. What should I watch for while using this medicine? Visit your doctor or health care professional for regular checkups. It may be some time before you see the benefit from this medicine. Do not stop taking your medicine unless your doctor tells you to. Your doctor may order blood tests or other tests to see how you are doing. Women should inform their doctor if they wish to become pregnant or think they might be pregnant. There is a potential for serious side effects to an unborn child. Talk to your health care professional or pharmacist for more information. You should make sure that you get enough calcium and vitamin D while you are taking this medicine. Discuss the foods you eat and the vitamins you take with your health care professional. Some people who take this medicine have severe bone, joint, and/or muscle pain. This medicine may also increase your risk for jaw problems or a broken thigh bone. Tell your doctor right away if you have severe pain in your jaw, bones, joints, or muscles. Tell your doctor if you have any pain that does not go away or that gets worse. Tell your dentist and dental surgeon that you are taking this medicine. You should not have major dental surgery while on this medicine. See your dentist to have a dental exam and fix any dental problems  before starting this medicine. Take good care of your teeth while on this medicine. Make sure you see your dentist for regular follow-up appointments. What side effects may I notice from receiving this medicine? Side effects that you should report to your doctor or health care professional as soon as possible: -allergic reactions like skin rash, itching or hives, swelling of the face, lips, or tongue -anxiety, confusion, or depression -breathing problems -changes in vision -eye pain -feeling faint or lightheaded, falls -jaw pain, especially after dental work -mouth sores -muscle cramps, stiffness, or weakness -redness, blistering, peeling or loosening of the skin, including inside the mouth -trouble passing urine or change in the amount of urine Side effects that usually do not require medical attention (report to your doctor or health care professional if they continue or are bothersome): -bone, joint, or muscle pain -constipation -diarrhea -fever -hair loss -irritation at site where injected -loss of appetite -nausea, vomiting -stomach upset -trouble sleeping -trouble swallowing -weak or tired This list may not describe all possible side effects. Call your doctor for medical advice about side effects. You may report side effects to FDA at 1-800-FDA-1088. Where should I keep my medicine?  This drug is given in a hospital or clinic and will not be stored at home. NOTE: This sheet is a summary. It may not cover all possible information. If you have questions about this medicine, talk to your doctor, pharmacist, or health care provider.  2017 Elsevier/Gold Standard (2013-11-09 14:19:39)

## 2016-07-15 NOTE — Progress Notes (Signed)
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 Due to weather and travel conditions, she was unable to get planned scans before this visit.  She will have PRBCs and zometa with IVF today.  She continued radiation to left mastectomy site 1-15 and 07-12-16, cancelled since then due to weather.   Patient feels somewhat more weak today, did walk into office and denies SOB at rest, no chest pain. No noted bleeding. She took Aleve x1 on 1-18 and oxycodone x1 during night, which have helped pain in shoulders and low back. Appetite is decreased tho still eating and drinking fluids, is more thirsty. Bowels are moving normally. She has had no fever or symptoms of infection. She had HA last pm resolved with oxydocone. No neurologic symptoms. No pain or discomfort at left mastectomy site. No cough or wheezing. No LE swelling. No abdominal or pelvic discomfort. Bladder ok. No problems with PAC. No fever or symptoms of infection. She has started peridex, denies dental pain.  Remainder of 14 point Review of Systems negative.    PAC replaced by IR on 12-30-15, flushed 07-11-16 Flu vaccine 04-04-16   ONCOLOGIC HISTORY Patient had no regular medical care for years when she noticed 2 masses in central left breast in Dec 2016. She was seen by Dr [name];  bilateral mammograms done 09-02-15 at [facility], with no priors for comparison, showed in the left breast a dense irregular mass at least 3 cm in greatest dimension, with pleomorphic calcifications extending 5 cm inferomedially and nipple inversion and retraction; also in the left breast a 10 mm irregular mass at 9-10 o'clock. In the  right breast an irregular mass centrally slightly medial to nipple 1.3 cm diameter. [place] on left breast showed the mass at 12 o'clock measured 2.8 x 2.4 x 2.5 cm with internal vascularity, and additional hypoechoic mass at 9 o'clock 0.8 x 0.4 x 0.6 cm. In right breast by [place] no suspicious solid or cystic mass identified. 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 Morphologically the neuroendocrine carcinoma  reportedly small to intermediate sized cells with high mitotic count and prominent necrosis, strongly positive for pancytokeratin and negative for chromogranin A, positive for synaptophysin, strongly positive for TTF-1, ER 1%, PR 105, Ki-67 of 96%. Biopsy of the right breast lesion ([id number]) at [facility], no malignancy.  Staging studies as follows:  Bone scan [facility] 10-05-15 with bilateral anterior lower chest activity not clearly malignant, symmetrical renal activity, osteoarthritis left knee. CT CAP at [facility] 09-30-15 : Chest no hilar or mediastinal adenopathy, thyroid normal, no pericardial effusion, no pulmonary lesions or pleural effusion, no lytic or blastic skeletal lesions. Abdomen numerous hepatic cysts without evidence of hepatic mets, adrenals mildly nodular, numerous renal cysts without hydronephrosis, spleen without focal lesions, pancreas normal, bowels unremarkable other than diverticulosis descending and sigmoid. No adenopathy. Pelvis no adenopathy, uterine fibroids, endometrium thickened at 23 mm , no ascites, bladder normal . PET [facility]vina Sidle 5-62-13 no hypermetabolic hilar ormediastinal nodes, no pulmonary uptake, left breast mass with SUV 22.89 measuring 3.3 cm without uptake in axillary, retropectoral or supraclavicular  nodes. An area of uptake anterior spleen with SUV max 5.63 corresponding to low density  structure not present on 09-30-15, no liver concerns, no other uptake abdomen or pelvis. No mention of endometrial lining. Diffuse heterogeneous uptake thru axial and appendicular skeleton which would be consistent either with diffuse marrow/ bone involvement or anemia. Dr Jacquiline Doe discussed with Drs Myra Gianotti Muss and L.Curey. Recommendation was for neoadjuvant carbo VP16 x 4 cycles with consideration of additional anthracyclines, then consideration of mastectomy with axillary node evaluation and further management depending on surgical findings.  Note patient refused MRI brain during initial staging.  Patient received cycle 1 carbo VP16 ~ May 1-2-3 in [place]; she received neulasta, however developed profound neutropenia without febrile complications, thrombocytopenia and anemia (hgb 7.9) for which she was transfused 2 units PRBCs. Cycle 2 was given 5-22,23,24, reportedly dose reduced from cycle 1. Cycle 2 carbo dose total was 445.5 mg and VP16 118 mg, with decadron 12 mg and Aloxi 0.25 mg. VP16 doses on 5-23 and 5-24 were each 118 mg. Neulasta given cycle 2. PerDr Jacquiline Doe, the left breast mass hadimproved only slightly with chemo given thru cycle 2. Cycle 3 carbo VP16 given at [facility] 6-19 thru 12-16-15, with neulasta. Due to concerns that the breast mass was not responding as expected, she had restaging CT CAP on 01-01-16 ,which showed breast mass 2.8 x 2.6 cm as compared with 3.3 x 2.6 cm on imaging 09-2015, stable area in spleen. Outside pathology was reviewed by [facility] pathologist, agreed with previous diagnosis. Cycle 4 Carbo VP16 was given 7-17 thru 01-13-16.Follow up PET after cycle 4, on 01-25-16 showed uptake in left breast mass SUV 17.4 compared with 22.9 previously, no hypermetabolic axillary or mediastinal nodes, stable uptake 5.5 in spleen with calcifications there identified on noncontrast images suggesting benign, diffuse marrow activity consistent with anemia and gCSF, probable gastritis. Cycle 5  was given days 1 and 2  due to concern for cytopenias and timing of upcoming surgery. She had left mastectomy with sentinel axillary lymph node evaluation by Dr Brantley Stage on 03-15-16. 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 Review of path confirms small cell features and LVSI present. Decision was to follow with close observation and give local radiation to mastectomy site. Start of radiation was delayed with wound healing problems, begun early Dec 2018.  Objective:  Vital signs in last 24 hours:  BP (!) 167/81 (BP Location: Right Arm, Patient Position: Sitting)   Pulse (!) 113   Temp 98.3 F (36.8 C) (Oral)   Resp 18   Ht 5' 3" (1.6 m)   Wt 186 lb 1.6 oz (84.4 kg)   SpO2 99%   BMI 32.97 kg/m   weight  dowm 3 lbs from 07-11-16 Alert, oriented and appropriate, more pale but not icteric, not in acute discomfort and respirations not labored. Ambulatory slowly without assistance into office, used WC subsequently.    HEENT:PERRL, sclerae not icteric. Oral mucosa moist without lesions, posterior pharynx clear. Very poor dentition and periodontal disease.  Neck supple. No JVD.  Lymphatics:no cervical,supraclavicular or bilateral axillary adenopathy Resp: clear to auscultation bilaterally and normal percussion bilaterally Cardio: tachy, regular rate and rhythm. No gallop. GI: soft, nontender, not distended, no mass or organomegaly. Normally active bowel sounds.  Musculoskeletal/ Extremities: Spine not tender to palpation. LUE/  LE without pitting edema,  cords, tenderness. Neuro:Speech fluent, CN intact, moves all extremities equally. Otherwise nonfocal. PSYCH appropriate mood and affect Skin without rash, ecchymosis, petechiae, Mucous membranes and nailbeds pale. Breasts: left mastectomy scar with  slight erythema around the thick eschar, which is new compared with exam of 07-11-16. Not tender, not hot. Slight moisture at eschar edges. . Portacath-without erythema or tenderness  Lab Results:  Results for orders placed or performed in visit on 07/15/16  TECHNOLOGIST REVIEW  Result Value Ref Range   Technologist Review Oc shistocyte   Comprehensive metabolic panel  Result Value Ref Range   Sodium 142 136 - 145 mEq/L   Potassium 3.8 3.5 - 5.1 mEq/L   Chloride 108 98 - 109 mEq/L   CO2 23 22 - 29 mEq/L   Glucose 114 70 - 140 mg/dl   BUN 13.4 7.0 - 26.0 mg/dL   Creatinine 1.0 0.6 - 1.1 mg/dL   Total Bilirubin 0.96 0.20 - 1.20 mg/dL   Alkaline Phosphatase 214 (H) 40 - 150 U/L   AST 22 5 - 34 U/L   ALT 20 0 - 55 U/L   Total Protein 7.1 6.4 - 8.3 g/dL   Albumin 2.4 (L) 3.5 - 5.0 g/dL   Calcium 11.4 (H) 8.4 - 10.4 mg/dL   Anion Gap 10 3 - 11 mEq/L   EGFR 70 (L) >90 ml/min/1.73 m2  CBC & Diff and Retic  Result Value Ref Range   WBC 5.5 3.9 - 10.3 10e3/uL   NEUT# 4.5 1.5 - 6.5 10e3/uL   HGB 7.3 (L) 11.6 - 15.9 g/dL   HCT 22.9 (L) 34.8 - 46.6 %   Platelets 142 (L) 145 - 400 10e3/uL   MCV 81.8 79.5 - 101.0 fL   MCH 26.1 25.1 - 34.0 pg   MCHC 31.9 31.5 - 36.0 g/dL   RBC 2.80 (L) 3.70 - 5.45 10e6/uL   RDW 15.3 (H) 11.2 - 14.5 %   lymph# 0.5 (L) 0.9 - 3.3 10e3/uL   MONO# 0.5 0.1 - 0.9 10e3/uL   Eosinophils Absolute 0.0 0.0 - 0.5 10e3/uL   Basophils Absolute 0.0 0.0 - 0.1 10e3/uL   NEUT% 82.5 (H) 38.4 - 76.8 %   LYMPH% 8.4 (L) 14.0 - 49.7 %   MONO% 8.9 0.0 - 14.0 %   EOS% 0.2 0.0 - 7.0 %   BASO% 0.0 0.0 - 2.0 %   Retic % 2.48 (H) 0.70 - 2.10 %   Retic Ct Abs 69.44 33.70 - 90.70 10e3/uL   Immature Retic Fract 16.40 (H) 1.60 - 10.00 %   No immature WBCs.   Labs reviewed with patient and husband  Studies/Results:  CT CAP rescheduled to 07-18-16.  Bone marrow with sedation by IR 07-20-16 PET may need preauth, scheduled for 07-25-16  Medications: I have reviewed the patient's  current medications. Oxycodone rewritten for 10 mg q 6 hr prn pain (note has used x1 in last 48 hrs, has ~ 3 left from prescription given after mastectomy in 02-2016.   Continue peridex Husband wants her to start multivitamin, fine.   DISCUSSION Patient and husband understand that findings are concerning for progression of malignancy. They agree with PRBCs and zometa today, and with other workup pending. She will have repeat labs when she comes for radiology next week, and I will see her after PET on 1-29. She and husband understand that they should call if any concerns prior to scheduled appointments.   Not discussed, topotecan would be consideration depending on workup pending. Note  she had only PR to [place] VP16 used thru 02-09-16.  Dr Julien Nordmann is aware and is willing to assume her care beginning Feb if requested.  Note medical oncology in [place] would be more convenient for her, but not presently available.    Assessment/Plan:  1.Progressive anemia, some improvement in LFTs compared with earlier this week, increased bone pain and more elevated calcium: evaluation delayed due to weather conditions, with CT CAP and bone marrow planned next week, then PET scheduled for 1-29 (may still need preauth). Transfuse 2 units PRBCs today, zometa + IVF. PTH pending. No immature WBCs. Haptoglobin pending, bili not elevated. No overt bleeding.  2. 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 May be progressive metastatic disease. Prn pain medication and workup as above 2.cytopenias with chemo requiring gCSF and transfusions. Agrees to bone marrow exam, will need sedation. Hemoglobin down a gram since 1-15, for 2 units PRBCs today and will repeat counts next week. 3.hypercalcemia: previously some variable low elevation,  PTH sent now. Zometa + IVF today. 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 HTN untreated at presentation. Still does not have PCP 6.gastritis symptoms resolved with protonix 7.Flu vaccine 04-04-16 8.very poor dentition: has previously declined referral to dental medicine. Now using peridex 9. Osteoarthritis left knee. Tho could have other arthritis aggravated by positioning/ RT table, with other findings this does not seem as likely for pain 10. PAC in  11. No advance directives per EMR   All questions answered and they know to call if needed. Discussed with infusion RN and [place] pharmacist. Message to managed care re PET. Communication with physician colleague. Time spent 40 min including >50% counseling and coordination of care. CC Dr Lianne Cure  Evlyn Clines, MD   07/15/2016, 2:29 PM

## 2016-07-15 NOTE — Telephone Encounter (Signed)
-----   Message from [name], MD sent at 07/15/2016 10:04 AM EST ----- While she is here today, please be sure she gets printed apts for scans, BM, PET already in EMR, and lab next week and LL/lab 1-29.  Thank you Lennis

## 2016-07-15 NOTE — Telephone Encounter (Signed)
Reviewed appointment calendar for for scans, lab, BM BX, PET scan and visit with Dr. Marko Plume . Sll scheduled in January 2018 as requested by Dr. Marko Plume.

## 2016-07-16 LAB — PTH, INTACT AND CALCIUM
Calcium, Ser: 10.6 mg/dL — ABNORMAL HIGH (ref 8.7–10.3)
PTH, Intact: 122 pg/mL — ABNORMAL HIGH (ref 15–65)

## 2016-07-16 LAB — HAPTOGLOBIN: HAPTOGLOBIN: 504 mg/dL — AB (ref 34–200)

## 2016-07-18 ENCOUNTER — Ambulatory Visit (HOSPITAL_COMMUNITY): Payer: Medicare PPO

## 2016-07-18 ENCOUNTER — Encounter (HOSPITAL_COMMUNITY): Payer: Self-pay

## 2016-07-18 ENCOUNTER — Other Ambulatory Visit: Payer: Medicare PPO

## 2016-07-18 ENCOUNTER — Ambulatory Visit (HOSPITAL_COMMUNITY)
Admission: RE | Admit: 2016-07-18 | Discharge: 2016-07-18 | Disposition: A | Discharge status: Home / Self Care | Payer: Medicare PPO | Source: Ambulatory Visit | Attending: Oncology | Admitting: Oncology

## 2016-07-18 DIAGNOSIS — D7389 Other diseases of spleen: Secondary | ICD-10-CM | POA: Insufficient documentation

## 2016-07-18 DIAGNOSIS — R938 Abnormal findings on diagnostic imaging of other specified body structures: Secondary | ICD-10-CM | POA: Insufficient documentation

## 2016-07-18 DIAGNOSIS — C7951 Secondary malignant neoplasm of bone: Secondary | ICD-10-CM | POA: Insufficient documentation

## 2016-07-18 DIAGNOSIS — C801 Malignant (primary) neoplasm, unspecified: Secondary | ICD-10-CM | POA: Insufficient documentation

## 2016-07-18 DIAGNOSIS — N281 Cyst of kidney, acquired: Secondary | ICD-10-CM | POA: Insufficient documentation

## 2016-07-18 DIAGNOSIS — Z9012 Acquired absence of left breast and nipple: Secondary | ICD-10-CM | POA: Insufficient documentation

## 2016-07-18 LAB — TYPE AND SCREEN
ABO/RH(D): O POS
ANTIBODY SCREEN: NEGATIVE
UNIT DIVISION: 0
Unit division: 0

## 2016-07-18 MED ORDER — IOPAMIDOL (ISOVUE-300) INJECTION 61%
INTRAVENOUS | Status: AC
  Administered 2016-07-18: 100 mL via INTRAVENOUS
  Filled 2016-07-18: qty 100

## 2016-07-18 MED ORDER — IOPAMIDOL (ISOVUE-300) INJECTION 61%
INTRAVENOUS | Status: AC
  Filled 2016-07-18: qty 30

## 2016-07-18 MED ORDER — IOPAMIDOL (ISOVUE-300) INJECTION 61%
30.0000 mL | Freq: Once | INTRAVENOUS | Status: AC | PRN
  Administered 2016-07-18: 30 mL via ORAL

## 2016-07-19 ENCOUNTER — Other Ambulatory Visit: Payer: Self-pay | Admitting: Student

## 2016-07-19 ENCOUNTER — Telehealth: Payer: Self-pay | Admitting: Oncology

## 2016-07-19 NOTE — Telephone Encounter (Signed)
Faxed last office note to Dr. Lianne Cure [phone]

## 2016-07-20 ENCOUNTER — Other Ambulatory Visit: Payer: Self-pay | Admitting: Oncology

## 2016-07-20 ENCOUNTER — Ambulatory Visit (HOSPITAL_COMMUNITY)
Admission: RE | Admit: 2016-07-20 | Discharge: 2016-07-20 | Disposition: A | Discharge status: Home / Self Care | Payer: Medicare PPO | Source: Ambulatory Visit | Attending: Oncology | Admitting: Oncology

## 2016-07-20 ENCOUNTER — Encounter: Payer: Self-pay | Admitting: Oncology

## 2016-07-20 ENCOUNTER — Encounter (HOSPITAL_COMMUNITY): Payer: Self-pay

## 2016-07-20 ENCOUNTER — Telehealth: Payer: Self-pay | Admitting: *Deleted

## 2016-07-20 DIAGNOSIS — C801 Malignant (primary) neoplasm, unspecified: Secondary | ICD-10-CM

## 2016-07-20 DIAGNOSIS — I10 Essential (primary) hypertension: Secondary | ICD-10-CM | POA: Diagnosis not present

## 2016-07-20 DIAGNOSIS — D630 Anemia in neoplastic disease: Secondary | ICD-10-CM | POA: Diagnosis present

## 2016-07-20 DIAGNOSIS — Z9889 Other specified postprocedural states: Secondary | ICD-10-CM | POA: Insufficient documentation

## 2016-07-20 DIAGNOSIS — Z853 Personal history of malignant neoplasm of breast: Secondary | ICD-10-CM | POA: Insufficient documentation

## 2016-07-20 DIAGNOSIS — F419 Anxiety disorder, unspecified: Secondary | ICD-10-CM | POA: Diagnosis not present

## 2016-07-20 DIAGNOSIS — F329 Major depressive disorder, single episode, unspecified: Secondary | ICD-10-CM | POA: Insufficient documentation

## 2016-07-20 DIAGNOSIS — N6489 Other specified disorders of breast: Secondary | ICD-10-CM | POA: Diagnosis not present

## 2016-07-20 DIAGNOSIS — C7951 Secondary malignant neoplasm of bone: Secondary | ICD-10-CM | POA: Insufficient documentation

## 2016-07-20 DIAGNOSIS — K219 Gastro-esophageal reflux disease without esophagitis: Secondary | ICD-10-CM | POA: Diagnosis not present

## 2016-07-20 DIAGNOSIS — Z9012 Acquired absence of left breast and nipple: Secondary | ICD-10-CM | POA: Diagnosis not present

## 2016-07-20 LAB — CBC
HCT: 27.7 % — ABNORMAL LOW (ref 36.0–46.0)
Hemoglobin: 9.1 g/dL — ABNORMAL LOW (ref 12.0–15.0)
MCH: 27.1 pg (ref 26.0–34.0)
MCHC: 32.9 g/dL (ref 30.0–36.0)
MCV: 82.4 fL (ref 78.0–100.0)
PLATELETS: 177 10*3/uL (ref 150–400)
RBC: 3.36 MIL/uL — ABNORMAL LOW (ref 3.87–5.11)
RDW: 15.6 % — AB (ref 11.5–15.5)
WBC: 4.8 10*3/uL (ref 4.0–10.5)

## 2016-07-20 LAB — APTT: aPTT: 90 seconds — ABNORMAL HIGH (ref 24–36)

## 2016-07-20 LAB — PROTIME-INR
INR: 1.2
Prothrombin Time: 15.3 seconds — ABNORMAL HIGH (ref 11.4–15.2)

## 2016-07-20 LAB — BONE MARROW EXAM

## 2016-07-20 MED ORDER — FENTANYL CITRATE (PF) 100 MCG/2ML IJ SOLN
INTRAMUSCULAR | Status: AC | PRN
  Administered 2016-07-20: 50 ug via INTRAVENOUS

## 2016-07-20 MED ORDER — MIDAZOLAM HCL 2 MG/2ML IJ SOLN
INTRAMUSCULAR | Status: AC | PRN
  Administered 2016-07-20 (×3): 1 mg via INTRAVENOUS

## 2016-07-20 MED ORDER — MIDAZOLAM HCL 2 MG/2ML IJ SOLN
INTRAMUSCULAR | Status: AC
  Filled 2016-07-20: qty 6

## 2016-07-20 MED ORDER — SODIUM CHLORIDE 0.9 % IV SOLN
INTRAVENOUS | Status: AC
  Filled 2016-07-20: qty 250

## 2016-07-20 MED ORDER — HEPARIN SOD (PORK) LOCK FLUSH 100 UNIT/ML IV SOLN
500.0000 [IU] | INTRAVENOUS | Status: AC | PRN
  Administered 2016-07-20: 500 [IU]
  Filled 2016-07-20: qty 5

## 2016-07-20 MED ORDER — ACETAMINOPHEN 500 MG PO TABS
1000.0000 mg | ORAL_TABLET | Freq: Four times a day (QID) | ORAL | Status: DC | PRN
  Administered 2016-07-20: 1000 mg via ORAL
  Filled 2016-07-20: qty 2

## 2016-07-20 MED ORDER — FENTANYL CITRATE (PF) 100 MCG/2ML IJ SOLN
INTRAMUSCULAR | Status: AC
  Filled 2016-07-20: qty 4

## 2016-07-20 MED ORDER — SODIUM CHLORIDE 0.9 % IV SOLN
INTRAVENOUS | Status: DC
  Administered 2016-07-20: 09:00:00 via INTRAVENOUS

## 2016-07-20 NOTE — Discharge Instructions (Signed)
Bone Marrow Aspiration and Bone Marrow Biopsy, Adult, Care After This sheet gives you information about how to care for yourself after your procedure. Your health care provider may also give you more specific instructions. If you have problems or questions, contact your health care provider. What can I expect after the procedure? After the procedure, it is common to have:  Mild pain and tenderness.  Swelling.  Bruising. Follow these instructions at home:  Take over-the-counter or prescription medicines only as told by your health care provider.  Do not take baths, swim, or use a hot tub until your health care provider approves. Ask if you can take a shower or have a sponge bath.  Follow instructions from your health care provider about how to take care of the puncture site. Make sure you:  Wash your hands with soap and water before you change your bandage (dressing). If soap and water are not available, use hand sanitizer.  Change your dressing as told by your health care provider.  Check your puncture siteevery day for signs of infection. Check for:  More redness, swelling, or pain.  More fluid or blood.  Warmth.  Pus or a bad smell.  Return to your normal activities as told by your health care provider. Ask your health care provider what activities are safe for you.  Do not drive for 24 hours if you were given a medicine to help you relax (sedative).  Keep all follow-up visits as told by your health care provider. This is important. Contact a health care provider if:  You have more redness, swelling, or pain around the puncture site.  You have more fluid or blood coming from the puncture site.  Your puncture site feels warm to the touch.  You have pus or a bad smell coming from the puncture site.  You have a fever.  Your pain is not controlled with medicine. This information is not intended to replace advice given to you by your health care provider. Make sure you  discuss any questions you have with your health care provider. Document Released: 12/31/2004 Document Revised: 01/01/2016 Document Reviewed: 11/25/2015 Elsevier Interactive Patient Education  2017 [place].   Moderate Conscious Sedation, Adult, Care After These instructions provide you with information about caring for yourself after your procedure. Your health care provider may also give you more specific instructions. Your treatment has been planned according to current medical practices, but problems sometimes occur. Call your health care provider if you have any problems or questions after your procedure. What can I expect after the procedure? After your procedure, it is common:  To feel sleepy for several hours.  To feel clumsy and have poor balance for several hours.  To have poor judgment for several hours.  To vomit if you eat too soon. Follow these instructions at home: For at least 24 hours after the procedure:   Do not:  Participate in activities where you could fall or become injured.  Drive.  Use heavy machinery.  Drink alcohol.  Take sleeping pills or medicines that cause drowsiness.  Make important decisions or sign legal documents.  Take care of children on your own.  Rest. Eating and drinking  Follow the diet recommended by your health care provider.  If you vomit:  Drink water, juice, or soup when you can drink without vomiting.  Make sure you have little or no nausea before eating solid foods. General instructions  Have a responsible adult stay with you until you are  awake and alert.  Take over-the-counter and prescription medicines only as told by your health care provider.  If you smoke, do not smoke without supervision.  Keep all follow-up visits as told by your health care provider. This is important. Contact a health care provider if:  You keep feeling nauseous or you keep vomiting.  You feel light-headed.  You develop a  rash.  You have a fever. Get help right away if:  You have trouble breathing. This information is not intended to replace advice given to you by your health care provider. Make sure you discuss any questions you have with your health care provider. Document Released: 04/03/2013 Document Revised: 11/16/2015 Document Reviewed: 10/03/2015 Elsevier Interactive Patient Education  2017 [employer].

## 2016-07-20 NOTE — Progress Notes (Signed)
Medical Oncology  Per [facility] managed care, PET is authorized, no peer to peer needed.  Godfrey Pick, MD

## 2016-07-20 NOTE — Telephone Encounter (Signed)
Oncology Nurse Navigator Documentation  Oncology Nurse Navigator Flowsheets 07/20/2016  Navigator Location [facility]  Navigator Encounter Type Telephone/I followed up with Dr. Julien Nordmann on when to schedule Maria Strickland. I called to schedule but was unable to reach her.    Telephone Outgoing Call  Treatment Phase Pre-Tx/Tx Discussion  Barriers/Navigation Needs Coordination of Care  Interventions Coordination of Care  Coordination of Care Appts  Acuity Level 1  Acuity Level 1 Initial guidance, education and coordination as needed  Time Spent with Patient 15

## 2016-07-20 NOTE — H&P (Signed)
Referring Physician(s): Livesay,Lennis P  Supervising Physician: Marybelle Killings  Patient Status:  [facility] OP  Chief Complaint:  "I'm getting a bone marrow biopsy"  Subjective: Patient familiar to IR service from prior right chest wall Port-A-Cath placement in July 2017. 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 She has recently had progressive anemia, increasing bone pain as well as hypercalcemia and fatigue. CT of chest/ abdomen/ pelvis on 07/18/16 revealed progressive osseous metastatic lesions involving the right third rib and T8 vertebral body. There were no obvious new lesions. Request now received for CT-guided bone marrow biopsy for further evaluation. She currently denies fever, chest pain, dyspnea, cough, abdominal/back pain, nausea, vomiting or abnormal bleeding. She has occasional headaches. Past Medical History:  Diagnosis Date  . Anemia due to chemotherapy   . Anxiety   . Cancer [facility])    left breast cancer  . Depression   . GERD (gastroesophageal reflux disease)   . Hypertension    Past Surgical History:  Procedure Laterality Date  . BREAST SURGERY     biospy  . MASTECTOMY W/ SENTINEL NODE BIOPSY Left 03/15/2016   Procedure: LEFT SIMPLE MASTECTOMY WITH SENTINEL LYMPH NODE MAPPING;  Surgeon: Erroll Luna, MD;  Location: [facility];  Service: General;  Laterality: Left;  . port-a-cath insertion  09/2015  . port-a-cath insertion  12/2015  . PORT-A-CATH REMOVAL     July 2017      Allergies: No known allergies  Medications: Prior to Admission medications   Medication Sig Start Date End Date Taking? Authorizing Provider  acetaminophen (TYLENOL) 500 MG tablet Take 1,000 mg by mouth every 4 (four) hours as needed for mild pain.    Yes Historical Provider, MD  atenolol (TENORMIN) 50 MG tablet Take 1 tablet (50 mg total) by mouth daily. 05/31/16  Yes Lennis Marion Downer, MD  chlorhexidine  (PERIDEX) 0.12 % solution Use as directed 15 mLs in the mouth or throat 2 (two) times daily. 07/12/16  Yes Lennis Marion Downer, MD  Cholecalciferol (VITAMIN D3) 5000 units CAPS Take 5,000 Units by mouth daily.   Yes Historical Provider, MD  lidocaine-prilocaine (EMLA) cream Apply 1 application topically as needed. Apply 1-2 hours prior to [facility] access  10/21/15  Yes Historical Provider, MD  Naproxen Sodium (ALEVE) 220 MG CAPS Take 1 capsule by mouth every 12 (twelve) hours as needed.   Yes Historical Provider, MD  oxyCODONE (OXY IR/ROXICODONE) 5 MG immediate release tablet Take 1 tablet (5 mg total) by mouth every 6 (six) hours as needed (pain). 07/15/16  Yes Lennis Marion Downer, MD  pantoprazole (PROTONIX) 40 MG tablet Take 1 tablet (40 mg total) by mouth daily. 05/31/16  Yes Lennis Marion Downer, MD  prochlorperazine (COMPAZINE) 10 MG tablet Take 1 tablet (10 mg total) by mouth every 6 (six) hours as needed for nausea or vomiting. 11/30/15  Yes Lennis P Livesay, MD  LORazepam (ATIVAN) 1 MG tablet Take 1 tablet (1 mg total) by mouth every 8 (eight) hours as needed. for anxiety and at bedtime 04/04/16   Lennis P Marko Plume, MD  temazepam (RESTORIL) 15 MG capsule Take 1 capsule (15 mg total) by mouth at bedtime as needed for sleep. Patient not taking: Reported on 07/11/2016 02/22/16   [name], MD     Vital Signs: BP (!) 169/92 (BP Location: Right Arm)   Pulse 91   Temp 98.4 F (36.9 C) (Oral)  Resp 16   SpO2 100%   Physical Exam Awake, alert. Chest clear to auscultation bilaterally.Clean, intact right chest wall Port-A-Cath. Heart with regular rate and rhythm. Abdomen soft, positive bowel sounds, nontender. Lower extremities with no significant edema.  Imaging: Ct Chest W Contrast  Result Date: 07/18/2016 CLINICAL DATA:  Restaging breast cancer. Initial diagnosis July 2017. Status post left mastectomy. EXAM: CT CHEST, ABDOMEN, AND PELVIS WITH CONTRAST TECHNIQUE: Multidetector CT imaging of the chest,  abdomen and pelvis was performed following the standard protocol during bolus administration of intravenous contrast. CONTRAST:  1 ISOVUE-300 IOPAMIDOL (ISOVUE-300) INJECTION 61% COMPARISON:  PET-CT 01/25/2016 FINDINGS: CT CHEST FINDINGS Chest wall: Surgical changes from a left mastectomy. No chest wall mass to suggest residual tumor. Stable surgical changes in the left axilla with a small postop fluid collection. The right breast is unremarkable. No supraclavicular or axillary lymphadenopathy. Stable small thyroid nodules. Cardiovascular: The heart is normal in size. No pericardial effusion. The aorta is normal in caliber. No dissection. The branch vessels are patent. Stable coronary artery calcifications. Mediastinum/Nodes: No mediastinal or hilar mass or adenopathy. The esophagus is grossly normal. Lungs/Pleura: No worrisome pulmonary nodules to suggest pulmonary metastatic disease. No acute pulmonary findings. No pleural effusion. Musculoskeletal: Slight progression of osseous metastatic disease. Destructive changes involving the right third rib with associated soft tissue component. Mixed lytic and sclerotic T8 lesion is progressive. The No canal compromise. CT ABDOMEN PELVIS FINDINGS Hepatobiliary: Stable numerous benign hepatic cysts. No worrisome hepatic lesions or intrahepatic biliary dilatation. The portal and hepatic veins are patent. No common bile duct dilatation. Pancreas: No mass, inflammation or ductal dilatation. Spleen: Stable mild splenomegaly and stable lesion in the anterior aspect of the spleen. Adrenals/Urinary Tract: The adrenal glands are unremarkable and stable. Stable numerous renal cysts. Stable renal scarring changes. Stomach/Bowel: The stomach, duodenum, small bowel and colon are grossly normal. No inflammatory changes, mass lesions or obstructive findings. Stable advanced sigmoid diverticulosis. The terminal ileum and appendix are normal. Vascular/Lymphatic: The aorta and branch  vessels are patent. The major venous structures are patent. Small scattered mesenteric and retroperitoneal lymph nodes but no mass or overt adenopathy. Reproductive: Markedly thickened endometrium for age along or soft tissue nodularity. This appears to be a relatively stable finding but correlation with pelvic ultrasound is recommended. Uterine fibroids are also suspected. Other: No pelvic lymphadenopathy or free pelvic fluid collections. No inguinal mass or adenopathy. No abdominal wall hernia or subcutaneous lesions. Musculoskeletal: No lytic or sclerotic bone lesions involving the pelvis or hips. IMPRESSION: 1. Status post left mastectomy. No findings for chest wall tumor, supraclavicular or axillary adenopathy. 2. No findings for pulmonary metastatic disease or mediastinal/hilar adenopathy. 3. Progressive osseous metastatic lesions involving the right third rib and the T8 vertebral body. No obvious new lesions. 4. Stable splenic lesion. 5. Stable hepatic and renal cysts. 6. Abnormal endometrium for age. This does appear relatively stable and was not hypermetabolic on the PET scan but I would recommend correlation with a pelvic ultrasound and any history of vaginal bleeding. Electronically Signed   By: Marijo Sanes M.D.   On: 07/18/2016 12:27   Ct Abdomen Pelvis W Contrast  Result Date: 07/18/2016 CLINICAL DATA:  Restaging breast cancer. Initial diagnosis July 2017. Status post left mastectomy. EXAM: CT CHEST, ABDOMEN, AND PELVIS WITH CONTRAST TECHNIQUE: Multidetector CT imaging of the chest, abdomen and pelvis was performed following the standard protocol during bolus administration of intravenous contrast. CONTRAST:  1 ISOVUE-300 IOPAMIDOL (ISOVUE-300) INJECTION 61% COMPARISON:  PET-CT  01/25/2016 FINDINGS: CT CHEST FINDINGS Chest wall: Surgical changes from a left mastectomy. No chest wall mass to suggest residual tumor. Stable surgical changes in the left axilla with a small postop fluid collection. The  right breast is unremarkable. No supraclavicular or axillary lymphadenopathy. Stable small thyroid nodules. Cardiovascular: The heart is normal in size. No pericardial effusion. The aorta is normal in caliber. No dissection. The branch vessels are patent. Stable coronary artery calcifications. Mediastinum/Nodes: No mediastinal or hilar mass or adenopathy. The esophagus is grossly normal. Lungs/Pleura: No worrisome pulmonary nodules to suggest pulmonary metastatic disease. No acute pulmonary findings. No pleural effusion. Musculoskeletal: Slight progression of osseous metastatic disease. Destructive changes involving the right third rib with associated soft tissue component. Mixed lytic and sclerotic T8 lesion is progressive. The No canal compromise. CT ABDOMEN PELVIS FINDINGS Hepatobiliary: Stable numerous benign hepatic cysts. No worrisome hepatic lesions or intrahepatic biliary dilatation. The portal and hepatic veins are patent. No common bile duct dilatation. Pancreas: No mass, inflammation or ductal dilatation. Spleen: Stable mild splenomegaly and stable lesion in the anterior aspect of the spleen. Adrenals/Urinary Tract: The adrenal glands are unremarkable and stable. Stable numerous renal cysts. Stable renal scarring changes. Stomach/Bowel: The stomach, duodenum, small bowel and colon are grossly normal. No inflammatory changes, mass lesions or obstructive findings. Stable advanced sigmoid diverticulosis. The terminal ileum and appendix are normal. Vascular/Lymphatic: The aorta and branch vessels are patent. The major venous structures are patent. Small scattered mesenteric and retroperitoneal lymph nodes but no mass or overt adenopathy. Reproductive: Markedly thickened endometrium for age along or soft tissue nodularity. This appears to be a relatively stable finding but correlation with pelvic ultrasound is recommended. Uterine fibroids are also suspected. Other: No pelvic lymphadenopathy or free pelvic  fluid collections. No inguinal mass or adenopathy. No abdominal wall hernia or subcutaneous lesions. Musculoskeletal: No lytic or sclerotic bone lesions involving the pelvis or hips. IMPRESSION: 1. Status post left mastectomy. No findings for chest wall tumor, supraclavicular or axillary adenopathy. 2. No findings for pulmonary metastatic disease or mediastinal/hilar adenopathy. 3. Progressive osseous metastatic lesions involving the right third rib and the T8 vertebral body. No obvious new lesions. 4. Stable splenic lesion. 5. Stable hepatic and renal cysts. 6. Abnormal endometrium for age. This does appear relatively stable and was not hypermetabolic on the PET scan but I would recommend correlation with a pelvic ultrasound and any history of vaginal bleeding. Electronically Signed   By: Marijo Sanes M.D.   On: 07/18/2016 12:27    Labs:  CBC:  Recent Labs  05/16/16 1044 07/11/16 1515 07/15/16 0834 07/20/16 0931  WBC 4.2 4.0 5.5 4.8  HGB 11.2* 8.1 Verified by recollection * 7.3* 9.1*  HCT 32.1* 24.3* 22.9* 27.7*  PLT 106* 197 142* 177    COAGS:  Recent Labs  12/30/15 1010 07/20/16 0931  INR 0.98 1.20  APTT  --  90*    BMP:  Recent Labs  03/08/16 0858 03/15/16 1930 03/16/16 0542  04/18/16 1149 05/16/16 1044 07/11/16 1515 07/15/16 0834  NA 144  --  143  < > 144 143 145 142  K 3.7  --  4.2  < > 4.2 4.1 4.1 3.8  CL 114*  --  116*  --   --   --   --   --   CO2 23  --  23  < > 22 21* 24 23  GLUCOSE 118*  --  106*  < > 96 96 105 114  BUN  12  --  13  < > 16.8 19.3 18.9 13.4  CALCIUM 10.9*  --  10.3  < > 10.5* 10.6* 11.7* 10.6*  11.4*  CREATININE 1.06* <0.30* 1.05*  < > 1.1 1.0 1.0 1.0  GFRNONAA 53* NOT CALCULATED 53*  --   --   --   --   --   GFRAA >60 NOT CALCULATED >60  --   --   --   --   --   < > = values in this interval not displayed.  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 She has recently had progressive anemia, increasing bone pain as well as hypercalcemia and fatigue. CT of chest/ abdomen/ pelvis on 07/18/16 revealed progressive osseous metastatic lesions involving the right third rib and T8 vertebral body. There were no obvious new lesions. Request now received for CT-guided bone marrow biopsy for further evaluation.Risks and benefits discussed with the patient/son including, but not limited to bleeding, infection, damage to adjacent structures or low yield requiring additional tests. All of the patient's questions were answered, patient is agreeable to proceed. Consent signed and in chart.      Electronically Signed: D. Rowe Robert 07/20/2016, 10:04 AM   I spent a total of 20 minutes at the the patient's bedside AND on the patient's hospital floor or unit, greater than 50% of which was counseling/coordinating care for CT-guided bone marrow biopsy

## 2016-07-20 NOTE — Procedures (Signed)
BM Bx No comp/EBL

## 2016-07-21 ENCOUNTER — Telehealth: Payer: Self-pay | Admitting: *Deleted

## 2016-07-21 NOTE — Telephone Encounter (Signed)
Oncology Nurse Navigator Documentation  Oncology Nurse Navigator Flowsheets 07/21/2016  Navigator Location [facility]  Navigator Encounter Type Telephone  Telephone Outgoing Call/I received referral from Dr. Marko Plume.  I clarified with Dr. Julien Nordmann. I called and scheduled patient to see Dr. Julien Nordmann on 08/03/16.  I called patient and she verbalized understanding of appt time and place.   Treatment Phase Pre-Tx/Tx Discussion  Barriers/Navigation Needs Coordination of Care  Interventions Coordination of Care  Coordination of Care Appts  Acuity Level 1  Acuity Level 1 Initial guidance, education and coordination as needed  Time Spent with Patient 15

## 2016-07-22 ENCOUNTER — Other Ambulatory Visit: Payer: Self-pay | Admitting: Oncology

## 2016-07-24 ENCOUNTER — Other Ambulatory Visit: Payer: Self-pay | Admitting: Oncology

## 2016-07-25 ENCOUNTER — Telehealth: Payer: Self-pay | Admitting: *Deleted

## 2016-07-25 ENCOUNTER — Other Ambulatory Visit (HOSPITAL_BASED_OUTPATIENT_CLINIC_OR_DEPARTMENT_OTHER): Payer: Medicare PPO

## 2016-07-25 ENCOUNTER — Encounter (HOSPITAL_COMMUNITY)
Admission: RE | Admit: 2016-07-25 | Discharge: 2016-07-25 | Disposition: A | Discharge status: Home / Self Care | Payer: Medicare PPO | Source: Ambulatory Visit | Attending: Oncology | Admitting: Oncology

## 2016-07-25 ENCOUNTER — Ambulatory Visit (HOSPITAL_BASED_OUTPATIENT_CLINIC_OR_DEPARTMENT_OTHER): Payer: Medicare PPO | Admitting: Oncology

## 2016-07-25 VITALS — BP 166/78 | HR 92 | Temp 97.8°F | Resp 18 | Ht 63.0 in | Wt 180.3 lb

## 2016-07-25 DIAGNOSIS — I10 Essential (primary) hypertension: Secondary | ICD-10-CM

## 2016-07-25 DIAGNOSIS — C801 Malignant (primary) neoplasm, unspecified: Secondary | ICD-10-CM

## 2016-07-25 DIAGNOSIS — Z9012 Acquired absence of left breast and nipple: Secondary | ICD-10-CM

## 2016-07-25 DIAGNOSIS — C7A1 Malignant poorly differentiated neuroendocrine tumors: Secondary | ICD-10-CM | POA: Diagnosis not present

## 2016-07-25 DIAGNOSIS — Z95828 Presence of other vascular implants and grafts: Secondary | ICD-10-CM

## 2016-07-25 DIAGNOSIS — C7B8 Other secondary neuroendocrine tumors: Secondary | ICD-10-CM | POA: Diagnosis not present

## 2016-07-25 DIAGNOSIS — D6189 Other specified aplastic anemias and other bone marrow failure syndromes: Secondary | ICD-10-CM

## 2016-07-25 DIAGNOSIS — C7951 Secondary malignant neoplasm of bone: Secondary | ICD-10-CM

## 2016-07-25 DIAGNOSIS — C787 Secondary malignant neoplasm of liver and intrahepatic bile duct: Secondary | ICD-10-CM

## 2016-07-25 LAB — CBC WITH DIFFERENTIAL/PLATELET
BASO%: 0.3 % (ref 0.0–2.0)
Basophils Absolute: 0 10*3/uL (ref 0.0–0.1)
EOS%: 1.8 % (ref 0.0–7.0)
Eosinophils Absolute: 0.1 10*3/uL (ref 0.0–0.5)
HEMATOCRIT: 27.5 % — AB (ref 34.8–46.6)
HGB: 9.1 g/dL — ABNORMAL LOW (ref 11.6–15.9)
LYMPH#: 0.3 10*3/uL — AB (ref 0.9–3.3)
LYMPH%: 8.3 % — ABNORMAL LOW (ref 14.0–49.7)
MCH: 26.7 pg (ref 25.1–34.0)
MCHC: 33.1 g/dL (ref 31.5–36.0)
MCV: 80.6 fL (ref 79.5–101.0)
MONO#: 0.2 10*3/uL (ref 0.1–0.9)
MONO%: 5.8 % (ref 0.0–14.0)
NEUT#: 3.3 10*3/uL (ref 1.5–6.5)
NEUT%: 83.8 % — AB (ref 38.4–76.8)
Platelets: 180 10*3/uL (ref 145–400)
RBC: 3.41 10*6/uL — AB (ref 3.70–5.45)
RDW: 15.8 % — ABNORMAL HIGH (ref 11.2–14.5)
WBC: 4 10*3/uL (ref 3.9–10.3)

## 2016-07-25 LAB — COMPREHENSIVE METABOLIC PANEL
ALT: 24 U/L (ref 0–55)
AST: 31 U/L (ref 5–34)
Albumin: 2.3 g/dL — ABNORMAL LOW (ref 3.5–5.0)
Alkaline Phosphatase: 144 U/L (ref 40–150)
Anion Gap: 13 mEq/L — ABNORMAL HIGH (ref 3–11)
BUN: 21.3 mg/dL (ref 7.0–26.0)
CHLORIDE: 112 meq/L — AB (ref 98–109)
CO2: 21 meq/L — AB (ref 22–29)
CREATININE: 0.9 mg/dL (ref 0.6–1.1)
Calcium: 9.7 mg/dL (ref 8.4–10.4)
EGFR: 72 mL/min/{1.73_m2} — ABNORMAL LOW (ref >90)
GLUCOSE: 92 mg/dL (ref 70–140)
Potassium: 3.5 mEq/L (ref 3.5–5.1)
SODIUM: 145 meq/L (ref 136–145)
Total Bilirubin: 0.79 mg/dL (ref 0.20–1.20)
Total Protein: 7.8 g/dL (ref 6.4–8.3)

## 2016-07-25 LAB — GLUCOSE, CAPILLARY: GLUCOSE-CAPILLARY: 104 mg/dL — AB (ref 65–99)

## 2016-07-25 MED ORDER — FLUDEOXYGLUCOSE F - 18 (FDG) INJECTION
10.2500 | Freq: Once | INTRAVENOUS | Status: AC | PRN
  Administered 2016-07-25: 10.25 via INTRAVENOUS

## 2016-07-25 NOTE — Telephone Encounter (Signed)
Oncology Nurse Navigator Documentation  Oncology Nurse Navigator Flowsheets 07/25/2016  Navigator Location [facility]  Navigator Encounter Type Telephone/I called patient to follow up on her smoking cessation.  I was unable to reach or leave a vm message.   Telephone Outgoing Call  [employer] Needs Education  Education Smoking cessation  Interventions Education  Education Method Verbal  Acuity Level 1  Time Spent with Patient 15

## 2016-07-25 NOTE — Progress Notes (Signed)
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  She will transfer care to Dr Julien Nordmann, first visit 08-04-15.   Patient had 2 units PRBCs on 07-15-16 for hemoglobin down to 7.3.  Zometa was also given 07-15-16  for elevated calcium,  prior to documentation of bone mets. She felt stronger after PRBCCs and had no problems with zometa. She had CT CAP 07-18-16, bone marrow with sedation by IR 07-20-16, and PET prior to visit today (no report yet).  See reports below. She has not had imaging of head, which she and husband do not want to have set up now.   She has been anxious and upset by all of situation. Appetite is poor, no nausea or vomiting, is able to drink liquids well tho is not fond of Ensure. Bowels are moving. She reports less pain in various joints. No bleeding. No increased SOB this week, no cough. No problems with PAC. No fever or symptoms of infection. She is ambulatory at home, using WC in office now. She denies HA, specific weakness, other neurologic symptoms. No problems at site of bone marrow exam. Last area of thick eschar at left mastectomy site came off this week, no drainage or pain there. No vaginal bleeding. Denies oral pain. Remainder of 14 point Review of Systems negative.    PAC replaced by IR on 12-30-15, flushed 07-20-16 Flu vaccine 04-04-16  ONCOLOGIC HISTORY Patient had no regular medical care for years when she noticed 2 masses in central left breast in Dec 2016. She was seen by Dr [name];  bilateral mammograms done 09-02-15 at [facility], with no priors for comparison, showed in  the left breast a dense irregular mass at least 3 cm in greatest dimension, with pleomorphic calcifications extending 5 cm inferomedially and nipple inversion and retraction; also in the left breast a 10 mm irregular mass at 9-10 o'clock. In the right breast an irregular mass centrally slightly medial to nipple 1.3 cm diameter. [place] on left breast showed the mass at 12 o'clock measured 2.8 x 2.4 x 2.5 cm with internal vascularity, and additional hypoechoic mass at 9 o'clock 0.8 x 0.4 x 0.6 cm. In right breast by [place] no suspicious solid or cystic mass identified. 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 Morphologically the neuroendocrine carcinoma  reportedly small to intermediate sized cells with high mitotic count and prominent necrosis, strongly positive for pancytokeratin and negative for chromogranin A, positive for synaptophysin, strongly positive for TTF-1, ER 1%, PR 105, Ki-67 of 96%. Biopsy of the right breast lesion ([id number]) at [facility], no malignancy.  Staging studies as follows:  Bone scan [place] 10-05-15 with bilateral anterior lower chest activity not clearly malignant, symmetrical renal activity, osteoarthritis left knee. CT CAP at [facility] 09-30-15 : Chest no hilar or mediastinal adenopathy, thyroid normal, no pericardial effusion, no pulmonary lesions or pleural effusion, no lytic or blastic skeletal lesions. Abdomen numerous hepatic cysts without evidence of hepatic mets, adrenals mildly nodular, numerous renal cysts without hydronephrosis, spleen without focal lesions, pancreas normal, bowels unremarkable other  than diverticulosis descending and sigmoid. No adenopathy. Pelvis no adenopathy, uterine fibroids, endometrium thickened at 23 mm , no ascites, bladder normal . PET [facility] 11-04-23 no hypermetabolic hilar  ormediastinal nodes, no pulmonary uptake, left breast mass with SUV 22.89 measuring 3.3 cm without uptake in axillary, retropectoral or supraclavicular nodes. An area of uptake anterior spleen with SUV max 5.63 corresponding to low density structure not present on 09-30-15, no liver concerns, no other uptake abdomen or pelvis. No mention of endometrial lining. Diffuse heterogeneous uptake thru axial and appendicular skeleton which would be consistent either with diffuse marrow/ bone involvement or anemia. Dr Jacquiline Doe discussed with Drs Myra Gianotti Muss and L.Curey. Recommendation was for neoadjuvant carbo VP16 x 4 cycles with consideration of additional anthracyclines, then consideration of mastectomy with axillary node evaluation and further management depending on surgical findings.  Note patient refused MRI brain during initial staging.  Patient received cycle 1 carbo VP16 ~ May 1-2-3 in [place]; she received neulasta, however developed profound neutropenia without febrile complications, thrombocytopenia and anemia (hgb 7.9) for which she was transfused 2 units PRBCs. Cycle 2 was given 5-22,23,24, reportedly dose reduced from cycle 1. Cycle 2 carbo dose total was 445.5 mg and VP16 118 mg, with decadron 12 mg and Aloxi 0.25 mg. VP16 doses on 5-23 and 5-24 were each 118 mg. Neulasta given cycle 2. PerDr Jacquiline Doe, the left breast mass hadimproved only slightly with chemo given thru cycle 2. Cycle 3 carbo VP16 given at [facility] 6-19 thru 12-16-15, with neulasta. Due to concerns that the breast mass was not responding as expected, she had restaging CT CAP on 01-01-16 ,which showed breast mass 2.8 x 2.6 cm as compared with 3.3 x 2.6 cm on imaging 09-2015, stable area in spleen. Outside pathology was reviewed by [facility] pathologist, agreed with previous diagnosis. Cycle 4 Carbo VP16 was given 7-17 thru 01-13-16.Follow up PET after cycle 4, on 01-25-16 showed uptake in left breast mass SUV 17.4 compared with 22.9 previously,  no hypermetabolic axillary or mediastinal nodes, stable uptake 5.5 in spleen with calcifications there identified on noncontrast images suggesting benign, diffuse marrow activity consistent with anemia and gCSF, probable gastritis. Cycle 5 was given days 1 and 2  due to concern for cytopenias and timing of upcoming surgery. She had left mastectomy with sentinel axillary lymph node evaluation by Dr Brantley Stage on 03-15-16. Pathology [phone]) from 03-15-16 left mastectomy poorly differentiated neuroendocrine carcinoma grade 3, size 4.5 cm, margins and skin/ nipple/ skeletal muscle not involved with closest margin posterior at 3 mm, 3 sentinel nodes negative. Review of path confirms small cell features and LVSI present. Decision was to follow with close observation and give local radiation to mastectomy site. Start of radiation was delayed with wound healing problems, begun early Dec 2018.  Patient had increased pain and progressive anemia in early Jan, with hgb 7.3 on 07-15-16. She had PRBCs and zometa 07-18-16. CT CAP 07-18-16 had involvement right third rib, T8 without canal compromise, nothing in lungs or obvious in liver, endometrium thickened. Bone marrow exam 07-20-16 [id number] had prominent involvement of poorly differentiated neuroendocrine carcinoma with trilineage hematopoieses.  PET 07-25-16 found liver mets not appreciated on the CT and much more extensive bone involvement than imaged on CT, including skull base and C1 C2, no residual chest wall involvement.    Objective:  Vital signs in last 24 hours:  BP (!) 166/78 (BP Location: Right Arm, Patient Position: Sitting)   Pulse 92   Temp 97.8 F (36.6  C) (Oral)   Resp 18   Ht 5' 3"  (1.6 m)   Wt 180 lb 4.8 oz (81.8 kg)   SpO2 100%   BMI 31.94 kg/m  Weight down 6 lbs from 07-15-16. Alert, oriented, cooperative, upset but not in acute discomfort.   HEENT:PERRL, sclerae not icteric. Oral mucosa moist without lesions, very poor dentition as  previously,b posterior pharynx clear.  Neck supple. No JVD.  Lymphatics:no cervical,supraclavicular, axillary adenopathy Resp: clear to auscultation bilaterally and normal percussion bilaterally Cardio: regular rate and rhythm. No gallop. GI: abdomen obese, soft, nontender, not distended, cannot appreciate organomegaly. Some bowel sounds.  Musculoskeletal/ Extremities: LE / UE without pitting edema, cords, tenderness Neuro: Speech fluent. Moves all extremities equally. No tremor. No other focal deficits. PSYCH mildly anxious and upset consistent with situation today. Skin without rash, ecchymosis, petechiae Breasts:right without dominant mass, skin or nipple findings. Left mastectomy scar central eschar gone, granulation tissue beneath, hyperpigmentation in radiation field, no nodularity or tenderness. Axillae benign. Portacath-without erythema or tenderness  Lab Results:  Results for orders placed or performed in visit on 07/25/16  CBC with Differential  Result Value Ref Range   WBC 4.0 3.9 - 10.3 10e3/uL   NEUT# 3.3 1.5 - 6.5 10e3/uL   HGB 9.1 (L) 11.6 - 15.9 g/dL   HCT 27.5 (L) 34.8 - 46.6 %   Platelets 180 145 - 400 10e3/uL   MCV 80.6 79.5 - 101.0 fL   MCH 26.7 25.1 - 34.0 pg   MCHC 33.1 31.5 - 36.0 g/dL   RBC 3.41 (L) 3.70 - 5.45 10e6/uL   RDW 15.8 (H) 11.2 - 14.5 %   lymph# 0.3 (L) 0.9 - 3.3 10e3/uL   MONO# 0.2 0.1 - 0.9 10e3/uL   Eosinophils Absolute 0.1 0.0 - 0.5 10e3/uL   Basophils Absolute 0.0 0.0 - 0.1 10e3/uL   NEUT% 83.8 (H) 38.4 - 76.8 %   LYMPH% 8.3 (L) 14.0 - 49.7 %   MONO% 5.8 0.0 - 14.0 %   EOS% 1.8 0.0 - 7.0 %   BASO% 0.3 0.0 - 2.0 %  Comprehensive metabolic panel  Result Value Ref Range   Sodium 145 136 - 145 mEq/L   Potassium 3.5 3.5 - 5.1 mEq/L   Chloride 112 (H) 98 - 109 mEq/L   CO2 21 (L) 22 - 29 mEq/L   Glucose 92 70 - 140 mg/dl   BUN 21.3 7.0 - 26.0 mg/dL   Creatinine 0.9 0.6 - 1.1 mg/dL   Total Bilirubin 0.79 0.20 - 1.20 mg/dL   Alkaline  Phosphatase 144 40 - 150 U/L   AST 31 5 - 34 U/L   ALT 24 0 - 55 U/L   Total Protein 7.8 6.4 - 8.3 g/dL   Albumin 2.3 (L) 3.5 - 5.0 g/dL   Calcium 9.7 8.4 - 10.4 mg/dL   Anion Gap 13 (H) 3 - 11 mEq/L   EGFR 72 (L) >90 ml/min/1.73 m2   Labs reviewed at visit  Studies/Results:   BONE MARROW  FINAL for KATORI, WIRSING ([id number]) Patient: MONTA, MAIORANA Collected: 07/20/2016 Client: [facility] Accession: [id number] Received: 07/20/2016 Art HossNE MARROW EPORT FINAL DIAGNOSIS Diagnosis Bone Marrow, Aspirate,Biopsy, and Clot - HYPERCELLULAR BONE MARROW WITH INVOLVEMENT BY METASTATIC CARCINOMA. - SEE COMMENT. PERIPHERAL BLOOD: - NORMOCYTIC-NORMOCHROMIC ANEMIA. - LYMPHOPENIA. Diagnosis Note The bone marrow is prominently involved by metastatic poorly differentiated carcinoma with neuroendocrine features. In the background, there is trilineage hematopoiesis with nonspecific changes. Clinical correlation is recommended. Susanne Greenhouse MD  EXAM: CT CHEST, ABDOMEN, AND PELVIS WITH CONTRAST  07-18-16  COMPARISON:  PET-CT 01/25/2016  FINDINGS: CT CHEST FINDINGS  Chest wall: Surgical changes from a left mastectomy. No chest wall mass to suggest residual tumor. Stable surgical changes in the left axilla with a small postop fluid collection. The right breast is unremarkable.  No supraclavicular or axillary lymphadenopathy. Stable small thyroid nodules.  Cardiovascular: The heart is normal in size. No pericardial effusion. The aorta is normal in caliber. No dissection. The branch vessels are patent. Stable coronary artery calcifications.  Mediastinum/Nodes: No mediastinal or hilar mass or adenopathy. The esophagus is grossly normal.  Lungs/Pleura: No worrisome pulmonary nodules to suggest pulmonary metastatic disease. No acute pulmonary findings. No pleural effusion.  Musculoskeletal: Slight progression of osseous metastatic disease. Destructive changes  involving the right third rib with associated soft tissue component. Mixed lytic and sclerotic T8 lesion is progressive. The No canal compromise.  CT ABDOMEN PELVIS FINDINGS  Hepatobiliary: Stable numerous benign hepatic cysts. No worrisome hepatic lesions or intrahepatic biliary dilatation. The portal and hepatic veins are patent. No common bile duct dilatation.  Pancreas: No mass, inflammation or ductal dilatation.  Spleen: Stable mild splenomegaly and stable lesion in the anterior aspect of the spleen.  Adrenals/Urinary Tract: The adrenal glands are unremarkable and stable. Stable numerous renal cysts. Stable renal scarring changes.  Stomach/Bowel: The stomach, duodenum, small bowel and colon are grossly normal. No inflammatory changes, mass lesions or obstructive findings. Stable advanced sigmoid diverticulosis. The terminal ileum and appendix are normal.  Vascular/Lymphatic: The aorta and branch vessels are patent. The major venous structures are patent. Small scattered mesenteric and retroperitoneal lymph nodes but no mass or overt adenopathy.  Reproductive: Markedly thickened endometrium for age along or soft tissue nodularity. This appears to be a relatively stable finding but correlation with pelvic ultrasound is recommended. Uterine fibroids are also suspected.  Other: No pelvic lymphadenopathy or free pelvic fluid collections. No inguinal mass or adenopathy. No abdominal wall hernia or subcutaneous lesions.  Musculoskeletal: No lytic or sclerotic bone lesions involving the pelvis or hips.  IMPRESSION: 1. Status post left mastectomy. No findings for chest wall tumor, supraclavicular or axillary adenopathy. 2. No findings for pulmonary metastatic disease or mediastinal/hilar adenopathy. 3. Progressive osseous metastatic lesions involving the right third rib and the T8 vertebral body. No obvious new lesions. 4. Stable splenic lesion. 5. Stable hepatic  and renal cysts. 6. Abnormal endometrium for age. This does appear relatively stable and was not hypermetabolic on the PET scan but I would recommend correlation with a pelvic ultrasound and any history of vaginal bleeding.       PET information not available at time of visit Nm Pet Image Restag (ps) Skull Base To Thigh  Result Date: 07/25/2016 CLINICAL DATA:  Subsequent treatment strategy for breast cancer air. EXAM: NUCLEAR MEDICINE PET SKULL BASE TO THIGH TECHNIQUE: 10.25 mCi F-18 FDG was injected intravenously. Full-ring PET imaging was performed from the skull base to thigh after the radiotracer. CT data was obtained and used for attenuation correction and anatomic localization. FASTING BLOOD GLUCOSE:  Value: 104 mg/dl COMPARISON:  PET-CT 01/25/2016 and 10/08/2015 FINDINGS: NECK Hypermetabolic 10 mm right-sided neck noted with SUV max of 14.2. No other neck adenopathy. There is a new hypermetabolic lesion in the right thyroid lobe which is likely a metastasis. This measures 16 mm and has an SUV max of 12.6. CHEST Status post left mastectomy. No findings for chest wall tumor or axillary adenopathy. No hypermetabolic mediastinal  or hilar nodes. No suspicious pulmonary nodules on the CT scan. There is a the pleural nodule in the right lower lobe on image number 62 which is hypermetabolic with SUV max of 92.0. This is adjacent to a metastatic rib lesion and is likely related to that. ABDOMEN/PELVIS Multiple new hepatic metastatic lesions which are very difficult to identify on the CT scan. The largest lesion is in the caudate lobe near the IVC has SUV max of 23.4. There are 2 other adjacent right lobe lesions on the same image. No metastatic lymphadenopathy is identified. Mild diffuse uptake in both adrenal glands but no focal lesions. SKELETON Interval development of diffuse and extensive osseous metastatic disease. This involves the axial and appendicular skeleton. No canal stenosis/compromise. The  right third posterior rib is destroyed and there is an associated soft tissue component. There are lesions involving the skull base, C1 and C2. IMPRESSION: 1. Interval development of widespread and extensive osseous metastatic disease. 2. New hepatic metastatic disease. 3. No pulmonary metastatic lesions. 4. No residual chest wall tumor or axillary adenopathy. Electronically Signed   By: Marijo Sanes M.D.   On: 07/25/2016 16:31    Medications: I have reviewed the patient's current medications.  DISCUSSION I have explained to patient and husband that the neuroendocrine carcinoma has spread in bones and bone marrow, which is causing worsening anemia. They understand that there may be other information from PET pending at time of visit. I have explained that any treatment will be in attempt to improve and control the cancer, which is not curable. She understands that Dr Julien Nordmann will be taking over her care. I have discussed with Dr Julien Nordmann, may recommend CDDP irinotecan day 1 day 8 every 3 weeks. I mentioned that treatments would be ~ 6 hrs.  Patient and husband express that this has all been very difficult for her. They do not want any additional scans now, tho I have told them that this cancer may go to brain and that brain imaging is recommended; she does not have obvious symptoms of brain mets now.  They seem agreeable to trying more chemotherapy.  Note they live in [place], [place] and initial care was by Dr Jacquiline Doe in [place]. That clinic would be much more convenient for them than [facility] if it becomes available.   Patient does not want to complete left chest wall radiation, ~ 11 treatments remaining by Dr Lianne Cure in [place]. There is no obvious local involvement by scans resulted to date. I will let Dr Lianne Cure know situation by this note, as he and I had also discussed omitting last of left chest wall radiation if metastatic disease documented. Likely he would be able to help if other areas need radiation.    Discussed importance of improving nutrtion, may try other supplements. Note liver mets by PET not known at time of visit.  Did not do chemo teaching today. Dr Julien Nordmann to do care plan when chemo decision finalized.   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 Anemia related to marrow involvement, better with transfusion 07-15-16 and no other acute symptoms. She may agree to chemotherapy in palliative attempt.  We appreciate Dr Julien Nordmann taking over her care. NOTE PET information not available at this visit,  not discussed.  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 DC rest of  planned left chest wall radiation now.  2.cytopenias with initial chemo requiring gCSF and transfusions.  Flow cytometry also sent on bone marrow specimen prior to finding metastatic ca in marrow. 3.hypercalcemia: improved with zometa 07-15-16.  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 HTN untreated at presentation. No PCP 6.gastritis symptoms resolved with protonix 7.Flu vaccine 04-04-16 8.very poor dentition: has declined referral to dental medicine. Using peridex 9. Osteoarthritis left knee. Tho could have other arthritis aggravated by positioning/ RT table, with other findings this does not seem as likely for pain 10. PAC in  11. No advance directives , patient has declined information. Note husband tells me that he and children do not want more testing that causes her anxiety and do not want interventions that are not helpful.   All questions answered and they know to call if concerns prior to next scheduled appointment. Time spent 35 min including >50% counseling and coordination of care. Cc Dr Julien Nordmann, Dr Lianne Cure, Dr Brantley Stage  Evlyn Clines, MD    07/25/2016, 5:49 PM

## 2016-07-27 ENCOUNTER — Encounter: Payer: Self-pay | Admitting: Oncology

## 2016-07-27 DIAGNOSIS — C787 Secondary malignant neoplasm of liver and intrahepatic bile duct: Secondary | ICD-10-CM | POA: Insufficient documentation

## 2016-07-27 DIAGNOSIS — C7951 Secondary malignant neoplasm of bone: Secondary | ICD-10-CM | POA: Insufficient documentation

## 2016-07-27 DIAGNOSIS — D649 Anemia, unspecified: Secondary | ICD-10-CM | POA: Insufficient documentation

## 2016-07-28 LAB — TISSUE HYBRIDIZATION (BONE MARROW)-NCBH

## 2016-07-28 LAB — CHROMOSOME ANALYSIS, BONE MARROW

## 2016-07-30 ENCOUNTER — Telehealth: Payer: Self-pay | Admitting: Internal Medicine

## 2016-07-30 NOTE — Telephone Encounter (Signed)
Lvm advising appt chg on 2/7. Added lab/flush. Advised on time chg to 1.30pm.

## 2016-08-02 ENCOUNTER — Other Ambulatory Visit: Payer: Self-pay | Admitting: Medical Oncology

## 2016-08-02 DIAGNOSIS — C801 Malignant (primary) neoplasm, unspecified: Secondary | ICD-10-CM

## 2016-08-03 ENCOUNTER — Encounter: Payer: Self-pay | Admitting: Internal Medicine

## 2016-08-03 ENCOUNTER — Ambulatory Visit (HOSPITAL_BASED_OUTPATIENT_CLINIC_OR_DEPARTMENT_OTHER): Payer: Medicare PPO | Admitting: Internal Medicine

## 2016-08-03 ENCOUNTER — Encounter (HOSPITAL_COMMUNITY): Payer: Self-pay

## 2016-08-03 ENCOUNTER — Other Ambulatory Visit: Payer: Medicare PPO

## 2016-08-03 VITALS — BP 166/99 | HR 102 | Temp 100.3°F | Resp 18 | Ht 63.0 in | Wt 179.9 lb

## 2016-08-03 DIAGNOSIS — C787 Secondary malignant neoplasm of liver and intrahepatic bile duct: Secondary | ICD-10-CM

## 2016-08-03 DIAGNOSIS — C7951 Secondary malignant neoplasm of bone: Secondary | ICD-10-CM

## 2016-08-03 DIAGNOSIS — C801 Malignant (primary) neoplasm, unspecified: Secondary | ICD-10-CM

## 2016-08-03 DIAGNOSIS — I10 Essential (primary) hypertension: Secondary | ICD-10-CM

## 2016-08-03 DIAGNOSIS — G4700 Insomnia, unspecified: Secondary | ICD-10-CM

## 2016-08-03 DIAGNOSIS — C50912 Malignant neoplasm of unspecified site of left female breast: Secondary | ICD-10-CM

## 2016-08-03 DIAGNOSIS — C50012 Malignant neoplasm of nipple and areola, left female breast: Secondary | ICD-10-CM

## 2016-08-03 NOTE — Progress Notes (Signed)
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   PRIOR THERAPY: 1) status post neoadjuvant systemic chemotherapy with carboplatin and etoposide under the care of Dr. Jacquiline Doe in [facility]. This treatment was complicated with severe pancytopenia. Imaging studies showed mild improvement. 2) status post left mastectomy with sentinel axillary lymph node dissection on 03/15/2016 under the care of Dr. Brantley Stage. The final pathology showed poorly differentiated neuroendocrine carcinoma grade 3 measuring 4.5 cm with close margin posteriorly and negative sentinel lymph node biopsies. There was also evidence of lymphovascular invasion. 3) adjuvant radiotherapy under the care of Dr. Smitty Knudsen in [facility]. This was not completed secondary to refusal of the patient and severe fatigue.  CURRENT THERAPY: None  INTERVAL HISTORY: Maria Strickland 72 y.o. female returns to the clinic today for follow-up visit accompanied by her husband. She is a new patient to me today. She was seen in the past by Dr. Marko Plume who is recently retired. This is a very pleasant 71 years old African-American female with unusual presentation of a small cell carcinoma involving the left breast and now metastatic to liver and bones in a patient who is a never smoker. She was treated with chemotherapy in the past including carboplatin and etoposide as well as left mastectomy and adjuvant radiotherapy which was not completed secondary to significant fatigue and weakness and effusion of the patient to continue. She had several studies performed recently including a PET scan as well as bone marrow biopsy and aspirate that showed infiltration of the bone marrow with malignant cells. Her PET scan which was  performed after the last time she saw Dr. Marko Plume showed extensive disease in the bones as well as liver. The patient is not feeling well today and she has increasing fatigue and generalized weakness but more in the lower extremities. She has no current chest pain, shortness of breath, cough or hemoptysis. She denied having any current nausea or vomiting. She has no fever or chills. She lost few pounds recently. She is here today for evaluation and discussion of her treatment options.  MEDICAL HISTORY: Past Medical History:  Diagnosis Date  . Anemia due to chemotherapy   . Anxiety   . Cancer [facility])    left breast cancer  . Depression   . GERD (gastroesophageal reflux disease)   . Hypertension     ALLERGIES:  is allergic to no known allergies.  MEDICATIONS:  Current Outpatient Prescriptions  Medication Sig Dispense Refill  . atenolol (TENORMIN) 50 MG tablet Take 1 tablet (50 mg total) by mouth daily. 90 tablet 1  . chlorhexidine (PERIDEX) 0.12 % solution Use as directed 15 mLs in the mouth or throat 2 (two) times daily. 120 mL 1  . Cholecalciferol (VITAMIN D3) 5000 units CAPS Take 5,000 Units by mouth daily.    Marland Kitchen lidocaine-prilocaine (EMLA) cream Apply 1 application topically as needed. Apply 1-2 hours prior to [facility] access     . LORazepam (ATIVAN) 1 MG tablet Take 1 tablet (1 mg total) by mouth every 8 (eight) hours as needed. for anxiety and at bedtime 10 tablet 0  . Naproxen Sodium (ALEVE) 220 MG CAPS Take 1 capsule by mouth every 12 (twelve) hours as needed.    Marland Kitchen oxyCODONE (OXY IR/ROXICODONE) 5 MG immediate  release tablet Take 1 tablet (5 mg total) by mouth every 6 (six) hours as needed (pain). 30 tablet 0  . pantoprazole (PROTONIX) 40 MG tablet Take 1 tablet (40 mg total) by mouth daily. 90 tablet 1  . acetaminophen (TYLENOL) 500 MG tablet Take 1,000 mg by mouth every 4 (four) hours as needed for mild pain.     Marland Kitchen prochlorperazine (COMPAZINE) 10 MG tablet Take 1 tablet (10 mg total) by  mouth every 6 (six) hours as needed for nausea or vomiting. (Patient not taking: Reported on 07/25/2016) 30 tablet 0  . temazepam (RESTORIL) 15 MG capsule Take 1 capsule (15 mg total) by mouth at bedtime as needed for sleep. (Patient not taking: Reported on 07/11/2016) 30 capsule 0   No current facility-administered medications for this visit.     SURGICAL HISTORY:  Past Surgical History:  Procedure Laterality Date  . BREAST SURGERY     biospy  . MASTECTOMY W/ SENTINEL NODE BIOPSY Left 03/15/2016   Procedure: LEFT SIMPLE MASTECTOMY WITH SENTINEL LYMPH NODE MAPPING;  Surgeon: Erroll Luna, MD;  Location: [facility];  Service: General;  Laterality: Left;  . port-a-cath insertion  09/2015  . port-a-cath insertion  12/2015  . PORT-A-CATH REMOVAL     July 2017    REVIEW OF SYSTEMS:  Constitutional: positive for anorexia, fatigue and weight loss Eyes: negative Ears, nose, mouth, throat, and face: negative Respiratory: negative Cardiovascular: negative Gastrointestinal: negative Genitourinary:negative Integument/breast: negative Hematologic/lymphatic: negative Musculoskeletal:positive for back pain, bone pain and muscle weakness Neurological: negative Behavioral/Psych: negative Endocrine: negative Allergic/Immunologic: negative   PHYSICAL EXAMINATION: General appearance: alert, cooperative, fatigued and no distress Head: Normocephalic, without obvious abnormality, atraumatic Neck: no adenopathy, no JVD, supple, symmetrical, trachea midline and thyroid not enlarged, symmetric, no tenderness/mass/nodules Lymph nodes: Cervical, supraclavicular, and axillary nodes normal. Resp: clear to auscultation bilaterally Back: symmetric, no curvature. ROM normal. No CVA tenderness. Cardio: normal apical impulse GI: soft, non-tender; bowel sounds normal; no masses,  no organomegaly Extremities: extremities normal, atraumatic, no cyanosis or edema Neurologic: Alert and oriented X 3, normal strength and  tone. Normal symmetric reflexes. Normal coordination and gait  ECOG PERFORMANCE STATUS: 2 - Symptomatic, <50% confined to bed  Blood pressure (!) 166/99, pulse (!) 102, temperature 100.3 F (37.9 C), temperature source Oral, resp. rate 18, height 5' 3"  (1.6 m), weight 179 lb 14.4 oz (81.6 kg), SpO2 97 %.  LABORATORY DATA: Lab Results  Component Value Date   WBC 4.0 07/25/2016   HGB 9.1 (L) 07/25/2016   HCT 27.5 (L) 07/25/2016   MCV 80.6 07/25/2016   PLT 180 07/25/2016      Chemistry      Component Value Date/Time   NA 145 07/25/2016 1507   K 3.5 07/25/2016 1507   CL 116 (H) 03/16/2016 0542   CO2 21 (L) 07/25/2016 1507   BUN 21.3 07/25/2016 1507   CREATININE 0.9 07/25/2016 1507      Component Value Date/Time   CALCIUM 9.7 07/25/2016 1507   ALKPHOS 144 07/25/2016 1507   AST 31 07/25/2016 1507   ALT 24 07/25/2016 1507   BILITOT 0.79 07/25/2016 1507       RADIOGRAPHIC STUDIES: Ct Chest W Contrast  Result Date: 07/18/2016 CLINICAL DATA:  Restaging breast cancer. Initial diagnosis July 2017. Status post left mastectomy. EXAM: CT CHEST, ABDOMEN, AND PELVIS WITH CONTRAST TECHNIQUE: Multidetector CT imaging of the chest, abdomen and pelvis was performed following the standard protocol during bolus administration of intravenous contrast. CONTRAST:  1  ISOVUE-300 IOPAMIDOL (ISOVUE-300) INJECTION 61% COMPARISON:  PET-CT 01/25/2016 FINDINGS: CT CHEST FINDINGS Chest wall: Surgical changes from a left mastectomy. No chest wall mass to suggest residual tumor. Stable surgical changes in the left axilla with a small postop fluid collection. The right breast is unremarkable. No supraclavicular or axillary lymphadenopathy. Stable small thyroid nodules. Cardiovascular: The heart is normal in size. No pericardial effusion. The aorta is normal in caliber. No dissection. The branch vessels are patent. Stable coronary artery calcifications. Mediastinum/Nodes: No mediastinal or hilar mass or adenopathy.  The esophagus is grossly normal. Lungs/Pleura: No worrisome pulmonary nodules to suggest pulmonary metastatic disease. No acute pulmonary findings. No pleural effusion. Musculoskeletal: Slight progression of osseous metastatic disease. Destructive changes involving the right third rib with associated soft tissue component. Mixed lytic and sclerotic T8 lesion is progressive. The No canal compromise. CT ABDOMEN PELVIS FINDINGS Hepatobiliary: Stable numerous benign hepatic cysts. No worrisome hepatic lesions or intrahepatic biliary dilatation. The portal and hepatic veins are patent. No common bile duct dilatation. Pancreas: No mass, inflammation or ductal dilatation. Spleen: Stable mild splenomegaly and stable lesion in the anterior aspect of the spleen. Adrenals/Urinary Tract: The adrenal glands are unremarkable and stable. Stable numerous renal cysts. Stable renal scarring changes. Stomach/Bowel: The stomach, duodenum, small bowel and colon are grossly normal. No inflammatory changes, mass lesions or obstructive findings. Stable advanced sigmoid diverticulosis. The terminal ileum and appendix are normal. Vascular/Lymphatic: The aorta and branch vessels are patent. The major venous structures are patent. Small scattered mesenteric and retroperitoneal lymph nodes but no mass or overt adenopathy. Reproductive: Markedly thickened endometrium for age along or soft tissue nodularity. This appears to be a relatively stable finding but correlation with pelvic ultrasound is recommended. Uterine fibroids are also suspected. Other: No pelvic lymphadenopathy or free pelvic fluid collections. No inguinal mass or adenopathy. No abdominal wall hernia or subcutaneous lesions. Musculoskeletal: No lytic or sclerotic bone lesions involving the pelvis or hips. IMPRESSION: 1. Status post left mastectomy. No findings for chest wall tumor, supraclavicular or axillary adenopathy. 2. No findings for pulmonary metastatic disease or  mediastinal/hilar adenopathy. 3. Progressive osseous metastatic lesions involving the right third rib and the T8 vertebral body. No obvious new lesions. 4. Stable splenic lesion. 5. Stable hepatic and renal cysts. 6. Abnormal endometrium for age. This does appear relatively stable and was not hypermetabolic on the PET scan but I would recommend correlation with a pelvic ultrasound and any history of vaginal bleeding. Electronically Signed   By: Marijo Sanes M.D.   On: 07/18/2016 12:27   Ct Abdomen Pelvis W Contrast  Result Date: 07/18/2016 CLINICAL DATA:  Restaging breast cancer. Initial diagnosis July 2017. Status post left mastectomy. EXAM: CT CHEST, ABDOMEN, AND PELVIS WITH CONTRAST TECHNIQUE: Multidetector CT imaging of the chest, abdomen and pelvis was performed following the standard protocol during bolus administration of intravenous contrast. CONTRAST:  1 ISOVUE-300 IOPAMIDOL (ISOVUE-300) INJECTION 61% COMPARISON:  PET-CT 01/25/2016 FINDINGS: CT CHEST FINDINGS Chest wall: Surgical changes from a left mastectomy. No chest wall mass to suggest residual tumor. Stable surgical changes in the left axilla with a small postop fluid collection. The right breast is unremarkable. No supraclavicular or axillary lymphadenopathy. Stable small thyroid nodules. Cardiovascular: The heart is normal in size. No pericardial effusion. The aorta is normal in caliber. No dissection. The branch vessels are patent. Stable coronary artery calcifications. Mediastinum/Nodes: No mediastinal or hilar mass or adenopathy. The esophagus is grossly normal. Lungs/Pleura: No worrisome pulmonary nodules to suggest pulmonary metastatic  disease. No acute pulmonary findings. No pleural effusion. Musculoskeletal: Slight progression of osseous metastatic disease. Destructive changes involving the right third rib with associated soft tissue component. Mixed lytic and sclerotic T8 lesion is progressive. The No canal compromise. CT ABDOMEN PELVIS  FINDINGS Hepatobiliary: Stable numerous benign hepatic cysts. No worrisome hepatic lesions or intrahepatic biliary dilatation. The portal and hepatic veins are patent. No common bile duct dilatation. Pancreas: No mass, inflammation or ductal dilatation. Spleen: Stable mild splenomegaly and stable lesion in the anterior aspect of the spleen. Adrenals/Urinary Tract: The adrenal glands are unremarkable and stable. Stable numerous renal cysts. Stable renal scarring changes. Stomach/Bowel: The stomach, duodenum, small bowel and colon are grossly normal. No inflammatory changes, mass lesions or obstructive findings. Stable advanced sigmoid diverticulosis. The terminal ileum and appendix are normal. Vascular/Lymphatic: The aorta and branch vessels are patent. The major venous structures are patent. Small scattered mesenteric and retroperitoneal lymph nodes but no mass or overt adenopathy. Reproductive: Markedly thickened endometrium for age along or soft tissue nodularity. This appears to be a relatively stable finding but correlation with pelvic ultrasound is recommended. Uterine fibroids are also suspected. Other: No pelvic lymphadenopathy or free pelvic fluid collections. No inguinal mass or adenopathy. No abdominal wall hernia or subcutaneous lesions. Musculoskeletal: No lytic or sclerotic bone lesions involving the pelvis or hips. IMPRESSION: 1. Status post left mastectomy. No findings for chest wall tumor, supraclavicular or axillary adenopathy. 2. No findings for pulmonary metastatic disease or mediastinal/hilar adenopathy. 3. Progressive osseous metastatic lesions involving the right third rib and the T8 vertebral body. No obvious new lesions. 4. Stable splenic lesion. 5. Stable hepatic and renal cysts. 6. Abnormal endometrium for age. This does appear relatively stable and was not hypermetabolic on the PET scan but I would recommend correlation with a pelvic ultrasound and any history of vaginal bleeding.  Electronically Signed   By: Marijo Sanes M.D.   On: 07/18/2016 12:27   Nm Pet Image Restag (ps) Skull Base To Thigh  Result Date: 07/25/2016 CLINICAL DATA:  Subsequent treatment strategy for breast cancer air. EXAM: NUCLEAR MEDICINE PET SKULL BASE TO THIGH TECHNIQUE: 10.25 mCi F-18 FDG was injected intravenously. Full-ring PET imaging was performed from the skull base to thigh after the radiotracer. CT data was obtained and used for attenuation correction and anatomic localization. FASTING BLOOD GLUCOSE:  Value: 104 mg/dl COMPARISON:  PET-CT 01/25/2016 and 10/08/2015 FINDINGS: NECK Hypermetabolic 10 mm right-sided neck noted with SUV max of 14.2. No other neck adenopathy. There is a new hypermetabolic lesion in the right thyroid lobe which is likely a metastasis. This measures 16 mm and has an SUV max of 12.6. CHEST Status post left mastectomy. No findings for chest wall tumor or axillary adenopathy. No hypermetabolic mediastinal or hilar nodes. No suspicious pulmonary nodules on the CT scan. There is a the pleural nodule in the right lower lobe on image number 62 which is hypermetabolic with SUV max of 81.1. This is adjacent to a metastatic rib lesion and is likely related to that. ABDOMEN/PELVIS Multiple new hepatic metastatic lesions which are very difficult to identify on the CT scan. The largest lesion is in the caudate lobe near the IVC has SUV max of 23.4. There are 2 other adjacent right lobe lesions on the same image. No metastatic lymphadenopathy is identified. Mild diffuse uptake in both adrenal glands but no focal lesions. SKELETON Interval development of diffuse and extensive osseous metastatic disease. This involves the axial and appendicular skeleton. No canal  stenosis/compromise. The right third posterior rib is destroyed and there is an associated soft tissue component. There are lesions involving the skull base, C1 and C2. IMPRESSION: 1. Interval development of widespread and extensive osseous  metastatic disease. 2. New hepatic metastatic disease. 3. No pulmonary metastatic lesions. 4. No residual chest wall tumor or axillary adenopathy. Electronically Signed   By: Marijo Sanes M.D.   On: 07/25/2016 16:31   Ct Biopsy  Result Date: 07/20/2016 INDICATION: Anemia EXAM: CT BIOPSY; CT BONE MARROW BIOPSY AND ASPIRATION MEDICATIONS: None. ANESTHESIA/SEDATION: Fentanyl 50 mcg IV; Versed Three mg IV Moderate Sedation Time:  7 The patient was continuously monitored during the procedure by the interventional radiology nurse under my direct supervision. FLUOROSCOPY TIME:  Fluoroscopy Time:  minutes  seconds ( mGy). COMPLICATIONS: None immediate. PROCEDURE: Informed written consent was obtained from the patient after a thorough discussion of the procedural risks, benefits and alternatives. All questions were addressed. Maximal Sterile Barrier Technique was utilized including caps, mask, sterile gowns, sterile gloves, sterile drape, hand hygiene and skin antiseptic. A timeout was performed prior to the initiation of the procedure. Under CT guidance, a(n) 11 gauge guide needle was advanced into the right iliac bone via posterior approach. Aspirates and a core were obtained. Post biopsy images demonstrate no hemorrhage. Patient tolerated the procedure well without complication. Vital sign monitoring by nursing staff during the procedure will continue as patient is in the special procedures unit for post procedure observation. FINDINGS: The images document guide needle placement within the right iliac bone. Post biopsy images demonstrate no hemorrhage. IMPRESSION: Successful CT-guided bone marrow aspirate and core. Electronically Signed   By: Marybelle Killings M.D.   On: 07/20/2016 14:05   Ct Bone Marrow Biopsy & Aspiration  Result Date: 07/20/2016 INDICATION: Anemia EXAM: CT BIOPSY; CT BONE MARROW BIOPSY AND ASPIRATION MEDICATIONS: None. ANESTHESIA/SEDATION: Fentanyl 50 mcg IV; Versed Three mg IV Moderate Sedation  Time:  7 The patient was continuously monitored during the procedure by the interventional radiology nurse under my direct supervision. FLUOROSCOPY TIME:  Fluoroscopy Time:  minutes  seconds ( mGy). COMPLICATIONS: None immediate. PROCEDURE: Informed written consent was obtained from the patient after a thorough discussion of the procedural risks, benefits and alternatives. All questions were addressed. Maximal Sterile Barrier Technique was utilized including caps, mask, sterile gowns, sterile gloves, sterile drape, hand hygiene and skin antiseptic. A timeout was performed prior to the initiation of the procedure. Under CT guidance, a(n) 11 gauge guide needle was advanced into the right iliac bone via posterior approach. Aspirates and a core were obtained. Post biopsy images demonstrate no hemorrhage. Patient tolerated the procedure well without complication. Vital sign monitoring by nursing staff during the procedure will continue as patient is in the special procedures unit for post procedure observation. FINDINGS: The images document guide needle placement within the right iliac bone. Post biopsy images demonstrate no hemorrhage. IMPRESSION: Successful CT-guided bone marrow aspirate and core. Electronically Signed   By: Marybelle Killings M.D.   On: 07/20/2016 14:05    ASSESSMENT AND PLAN: This is a new patient to me today. She is a very pleasant never smoker 71 years old African-American female with a very complicated history and an usual presentation with small cell cancer involving the left breast status post neoadjuvant systemic chemotherapy with carboplatin and etoposide with very minimal response. This was followed by left mastectomy in September 2017. She also received short course of adjuvant radiotherapy which was discontinued secondary to fatigue and refusal of  the patient to complete the course. The patient also refused to have imaging studies of her brain to rule out metastatic disease to the brain. She  had a recent PET scan and bone marrow biopsy and aspirate. Her bone marrow showed involvement of the marrow with malignant cells consistent with the small cell.. I personally and independently reviewed the PET scan images and discuss the results and showed the images to the patient and her husband. Unfortunately she has extensive disease involving the liver and multiple skeletal  Bone metastasis. I had a lengthy discussion with the patient and her husband today about her condition and treatment options. I discussed with him the goals of care. I strongly recommended for the patient to consider palliative care and hospice at this point. The patient declined this option and she is interested in proceeding with further treatment. I gave her few options for treatment of her condition including second line treatment with oral topotecan versus oral Temodar versus systemic chemotherapy with cisplatin 30 MG/M2 and irinotecan 65 MG/M2 on days 1 and 8 every 3 weeks. I discussed with the patient adverse effect of these treatments including but not limited to alopecia, myelosuppression, nausea and vomiting, peripheral neuropathy, liver or renal dysfunction. She is interested in proceeding with systemic chemotherapy with cisplatin and irinotecan. She would like to receive her treatment closer to home if possible. She lives in [place]. I called Dr.Katraghada at the [facility] in [place] which is the closest to her home town. I discussed with him the condition of this patient and her treatment plan. He kindly agreed to see the patient for evaluation and treatment close to home. For hypertension, she will continue her current treatment with atenolol. For pain management she is currently on OxyIR and Aleve. For insomnia, she will continue her current treatment with history. I gave the patient and her husband the time to ask questions and I answered them completely to their satisfaction. She was  advised to call if she has any concerning symptoms in the interval until she sees Dr. Delton Coombes. The patient voices understanding of current disease status and treatment options and is in agreement with the current care plan.  All questions were answered. The patient knows to call the clinic with any problems, questions or concerns. We can certainly see the patient much sooner if necessary.  I spent 30 minutes counseling the patient face to face. The total time spent in the appointment was 45 minutes.  Disclaimer: This note was dictated with voice recognition software. Similar sounding words can inadvertently be transcribed and may not be corrected upon review.

## 2016-08-05 ENCOUNTER — Telehealth: Payer: Self-pay | Admitting: Medical Oncology

## 2016-08-05 NOTE — Telephone Encounter (Signed)
Pt coming in today for appt. Records faxed 87 pages

## 2016-08-05 NOTE — Telephone Encounter (Signed)
Last refill in oct -none recently . I asked pharmacy to call back to clarify who filled it last and how many.Pt has been transferred from Dr Willette Alma to Dr Tasia Catchings in [place] who will see her next week. This rx requst needs to go to him or pt PCP as pt no longer a pt here.

## 2016-08-08 ENCOUNTER — Other Ambulatory Visit: Payer: Self-pay | Admitting: Medical Oncology

## 2016-08-08 ENCOUNTER — Encounter: Payer: Self-pay | Admitting: Internal Medicine

## 2016-08-08 ENCOUNTER — Telehealth: Payer: Self-pay | Admitting: Medical Oncology

## 2016-08-08 DIAGNOSIS — C801 Malignant (primary) neoplasm, unspecified: Secondary | ICD-10-CM

## 2016-08-08 MED ORDER — LORAZEPAM 1 MG PO TABS: 1.0000 mg | ORAL_TABLET | Freq: Three times a day (TID) | ORAL | 0 refills | Status: AC | PRN

## 2016-08-08 MED ORDER — OXYCODONE HCL 5 MG PO TABS: 5.0000 mg | ORAL_TABLET | Freq: Four times a day (QID) | ORAL | 0 refills | Status: AC | PRN

## 2016-08-08 MED ORDER — LORAZEPAM 1 MG PO TABS: 1.0000 mg | ORAL_TABLET | Freq: Three times a day (TID) | ORAL | 0 refills | Status: DC | PRN

## 2016-08-08 NOTE — Telephone Encounter (Signed)
Requesting refill on ativan and something for pain all over.

## 2016-08-08 NOTE — Telephone Encounter (Signed)
Ativan called in and rx ready for pick up -Stephanie notified.rx locked up.

## 2016-08-08 NOTE — Telephone Encounter (Signed)
error 

## 2016-08-10 ENCOUNTER — Other Ambulatory Visit: Payer: Self-pay | Admitting: Medical Oncology

## 2016-08-10 ENCOUNTER — Telehealth: Payer: Self-pay | Admitting: Medical Oncology

## 2016-08-10 NOTE — Telephone Encounter (Signed)
Pt fell yesterday.   Yesterday she was "out of it" and she may have taken ativan and morphine too close together.  Son reported she is clearer today . He said his father wanted a hospital bed and I told him to discuss with Dr Raliegh Ip. Oncologist on Monday.

## 2016-08-16 ENCOUNTER — Telehealth: Payer: Self-pay | Admitting: Internal Medicine

## 2016-08-16 NOTE — Telephone Encounter (Signed)
Per 08/03/16 los, patient was referred to Oncology services in [place], [place], Referral request sent to [facility] via schd msg, per Kim's request.

## 2017-04-22 IMAGING — DX DG CHEST 2V
2 series · 2 of 2 positions shown · non-contrast
Comparison: Chest CT 09/30/2015

CLINICAL DATA: Soreness around Port-A-Cath. Scheduled for
Port-A-Cath revision tomorrow. History of small cell carcinoma.
History of breast cancer.

EXAM:
CHEST  2 VIEW

[chest pa]
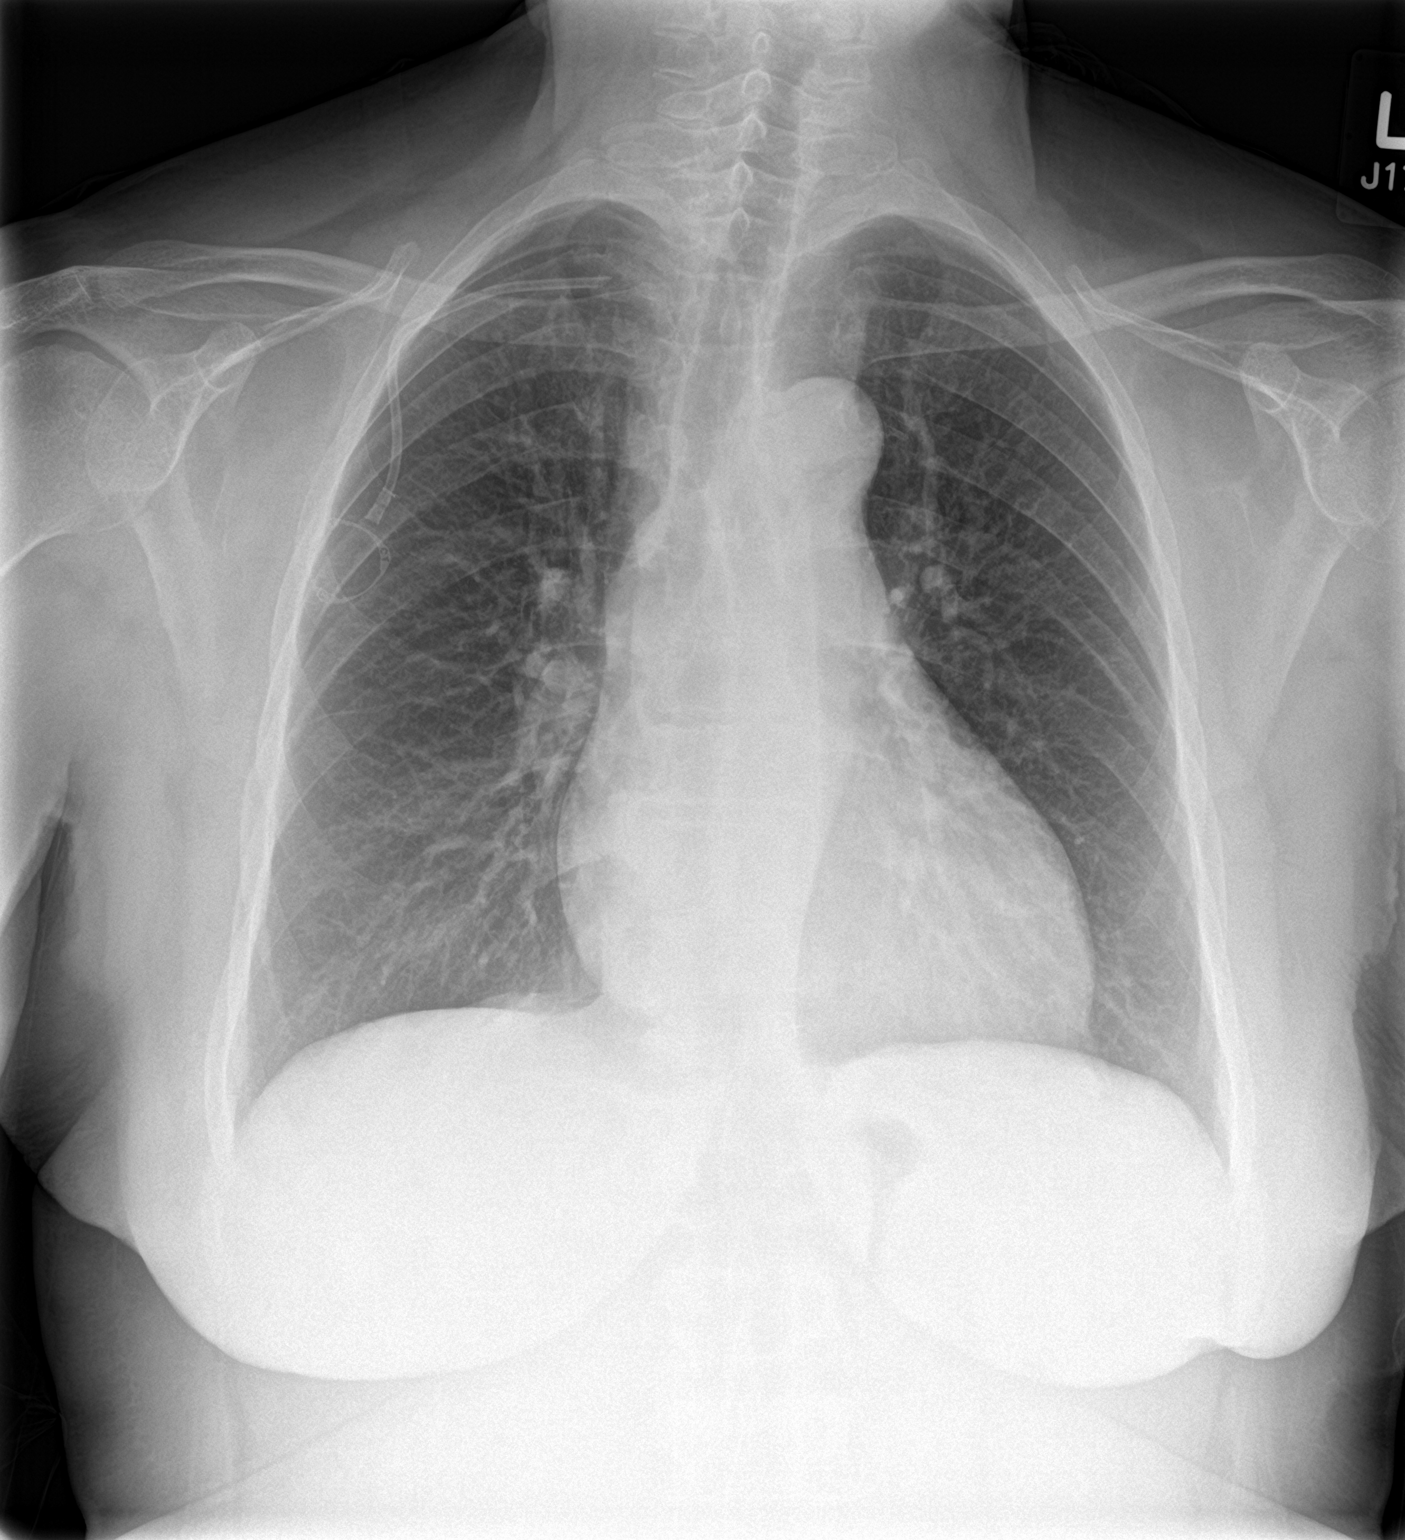

[chest lat]
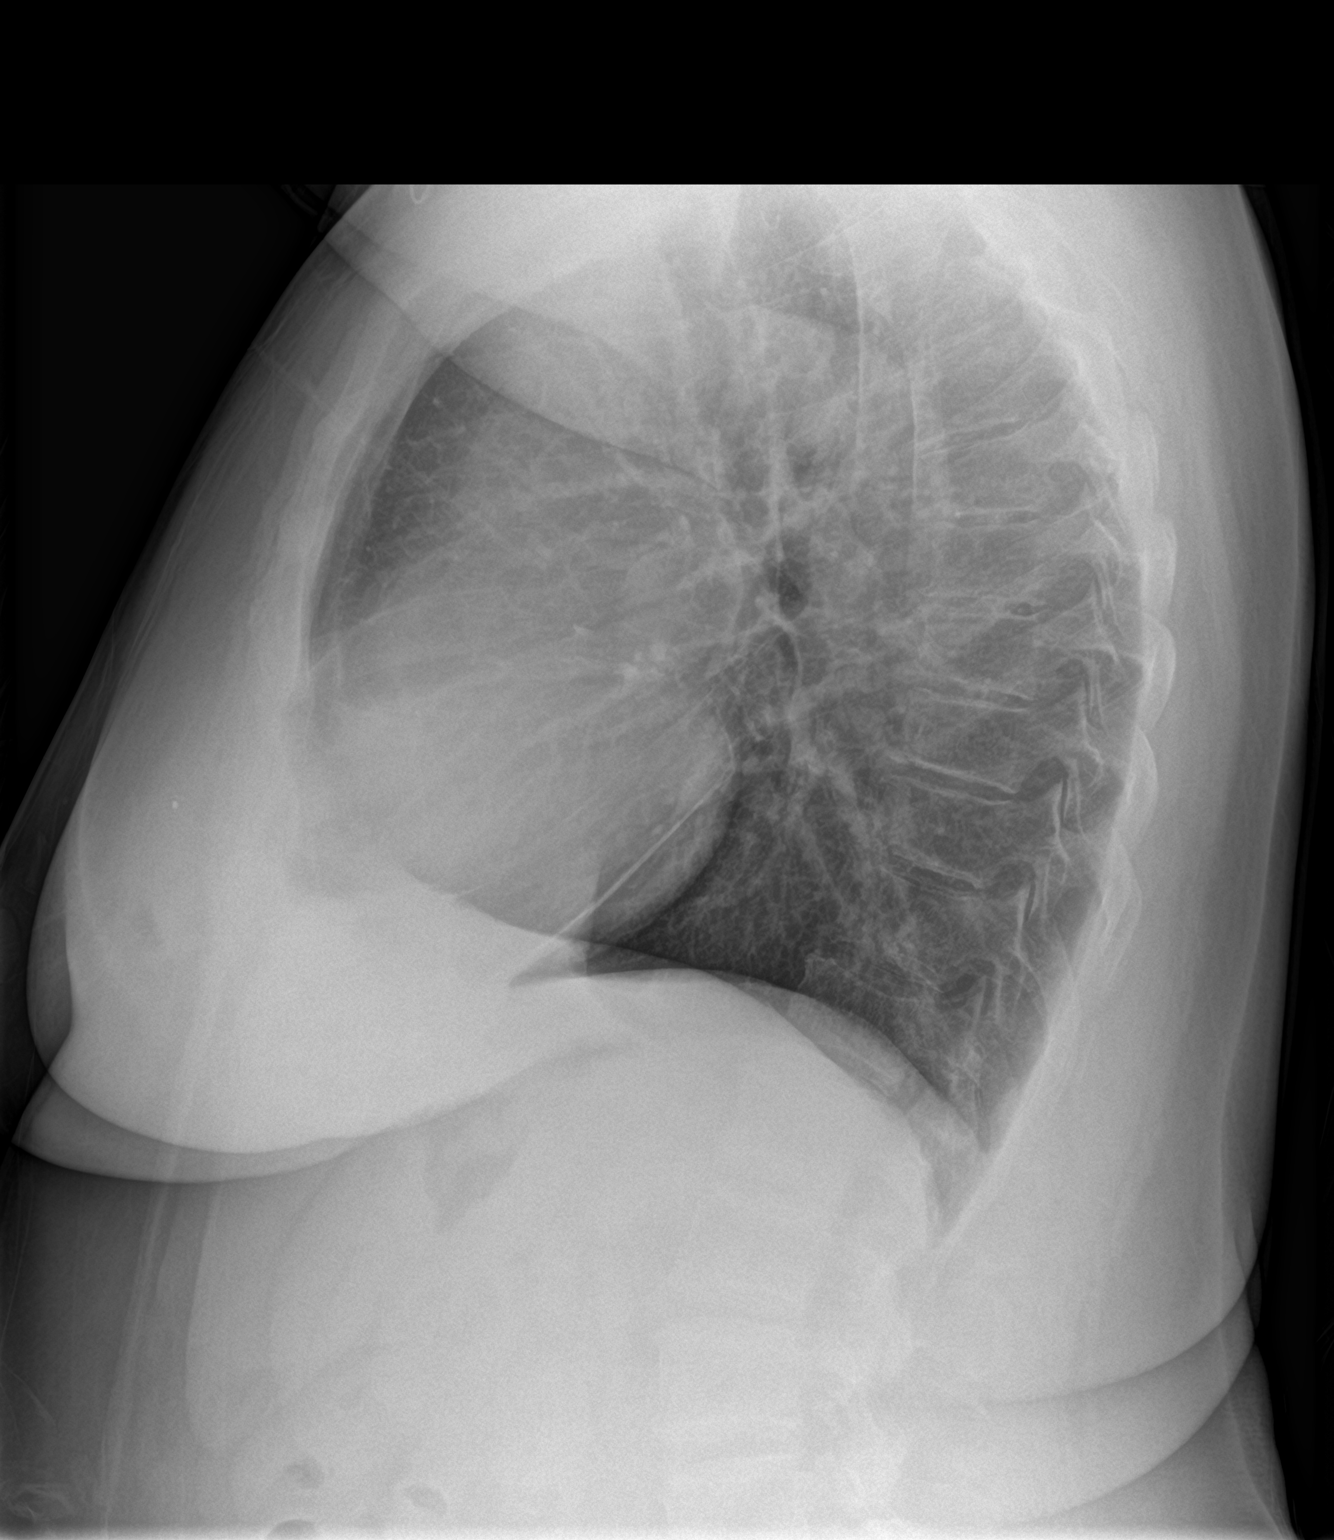

[2 of 2 positions shown; findings below may reference images not displayed]

FINDINGS: Right subclavian Port-A-Cath has tip near the junction of the
subclavian to internal jugular vein just above the head of the right
clavicle. Lungs are adequately inflated without consolidation or
effusion. Cardiomediastinal silhouette is within normal. There are
mild degenerative changes of the spine.
IMPRESSION: No acute cardiopulmonary disease per

Right subclavian Port-A-Cath with tip just above the head of the
right clavicle likely at the junction of the subclavian to internal
jugular vein.

## 2017-11-15 IMAGING — CT CT CHEST W/ CM
2 of 5 series · 14 of 46 positions shown, 16 images · IV contrast (ISOVUE)
Comparison: PET-CT 01/25/2016

CLINICAL DATA: Restaging breast cancer. Initial diagnosis December 2015. Status post left mastectomy.

EXAM:
CT CHEST, ABDOMEN, AND PELVIS WITH CONTRAST
TECHNIQUE: Multidetector CT imaging of the chest, abdomen and pelvis was
performed following the standard protocol during bolus
administration of intravenous contrast.
CONTRAST:  1 OEVBWS-9OO IOPAMIDOL (OEVBWS-9OO) INJECTION 61%

[Series 2: abd/pel with · axial · 0.74mm/px · z∈[+1176,+1651]mm · 11 of 115 slices shown, 13 images]
[im 10/115  soft-tissue]
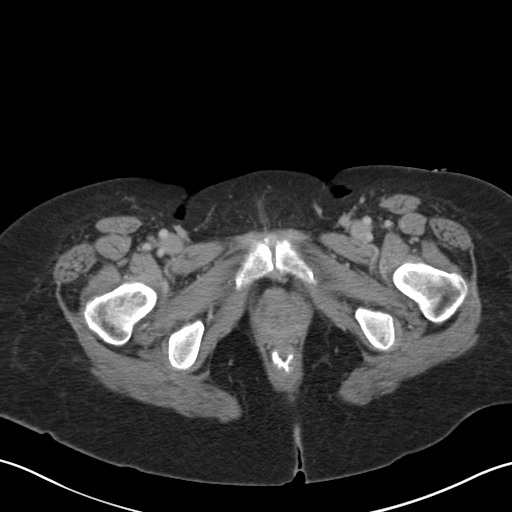
[im 10/115  bone]
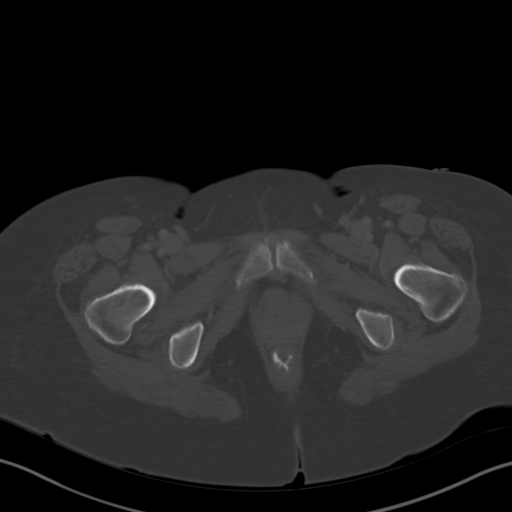
[im 20/115  soft-tissue]
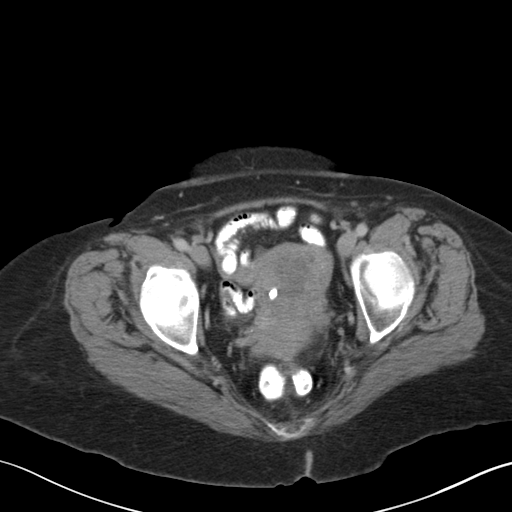
[im 29/115  soft-tissue]
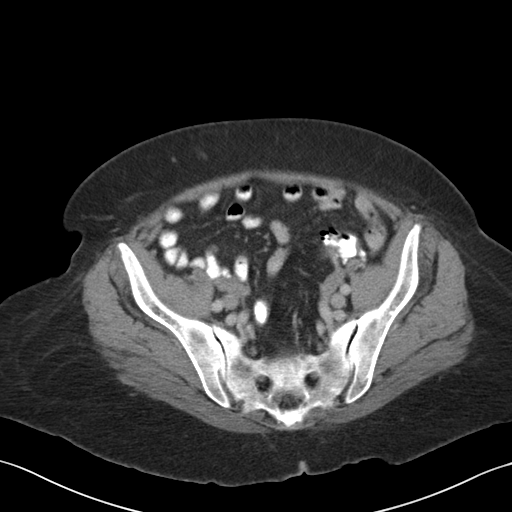
[im 39/115  soft-tissue]
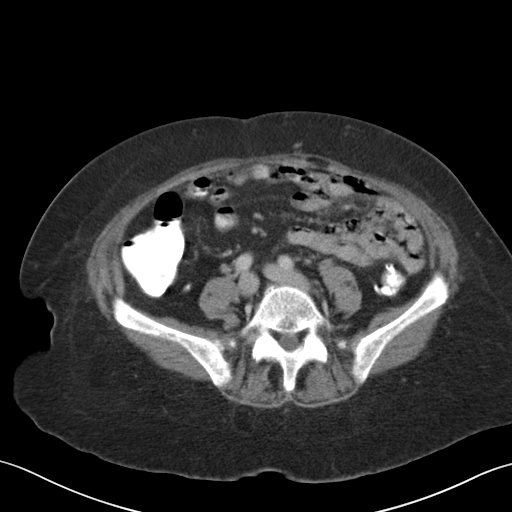
[im 48/115  soft-tissue]
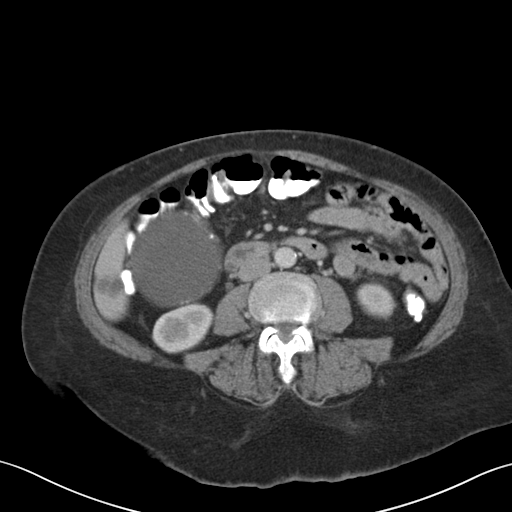
[im 58/115  soft-tissue]
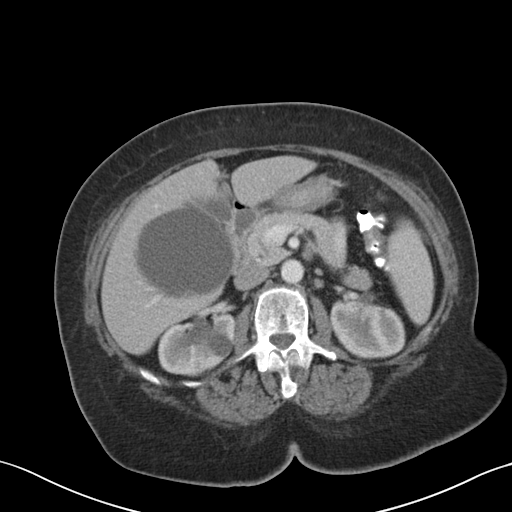
[im 67/115  soft-tissue]
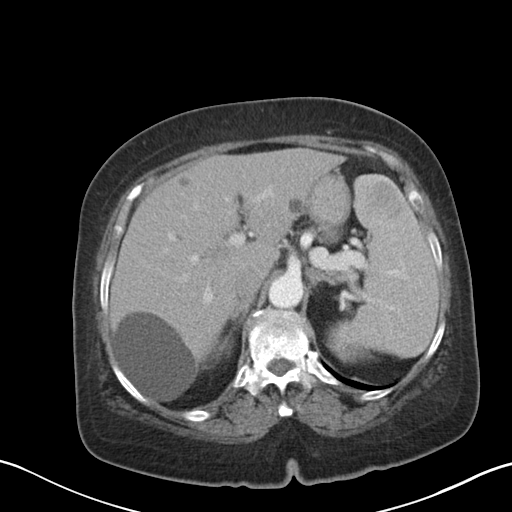
[im 77/115  soft-tissue]
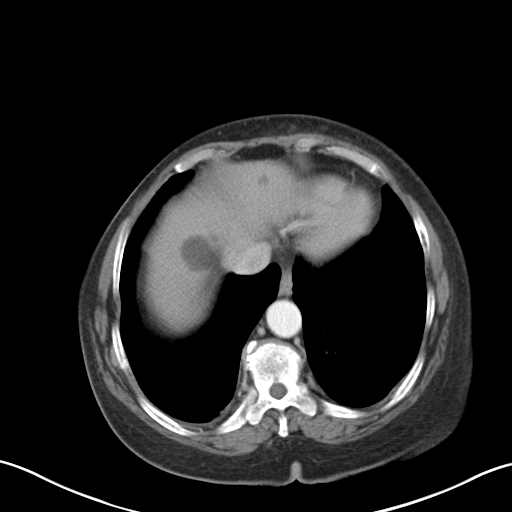
[im 86/115  soft-tissue]
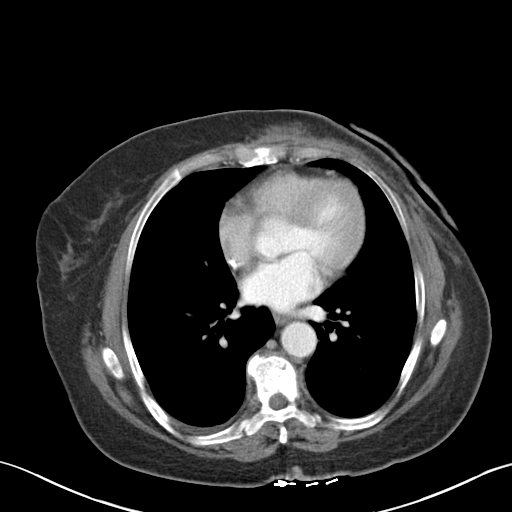
[im 86/115  bone]
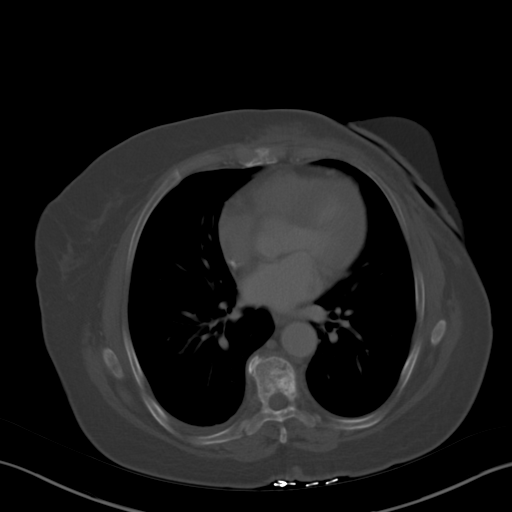
[im 96/115  soft-tissue]
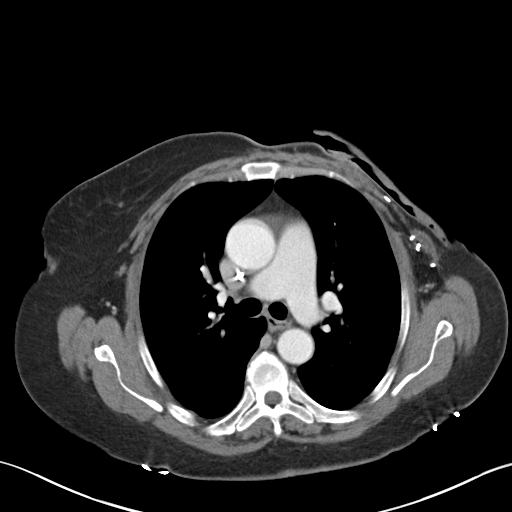
[im 105/115  soft-tissue]
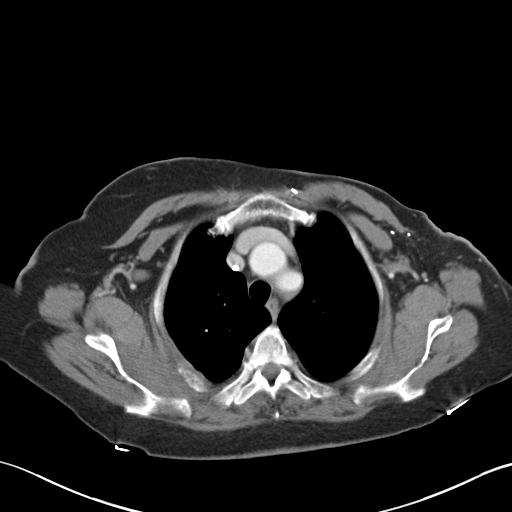

[Series 5: coronal a/|p · coronal · 0.74mm/px · 3 of 164 slices shown]
[im 55/164  soft-tissue]
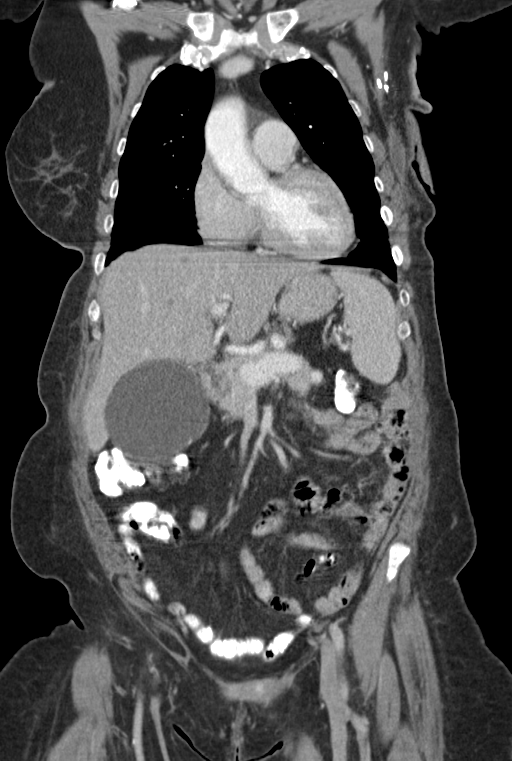
[im 73/164  soft-tissue]
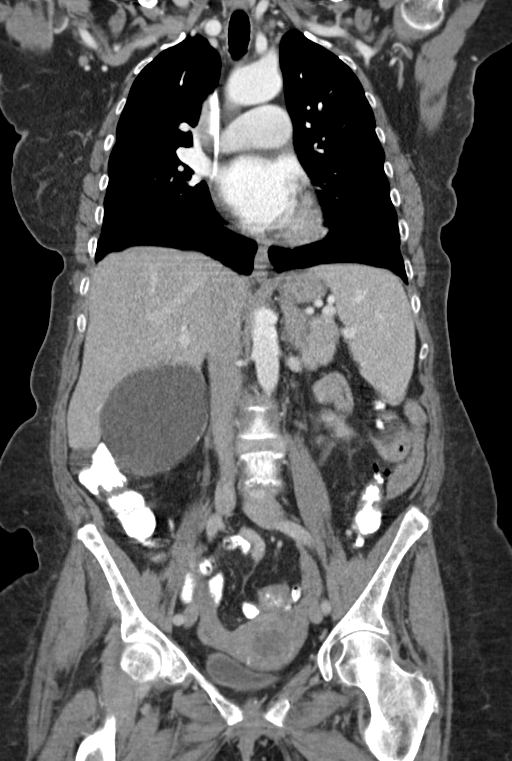
[im 91/164  soft-tissue]
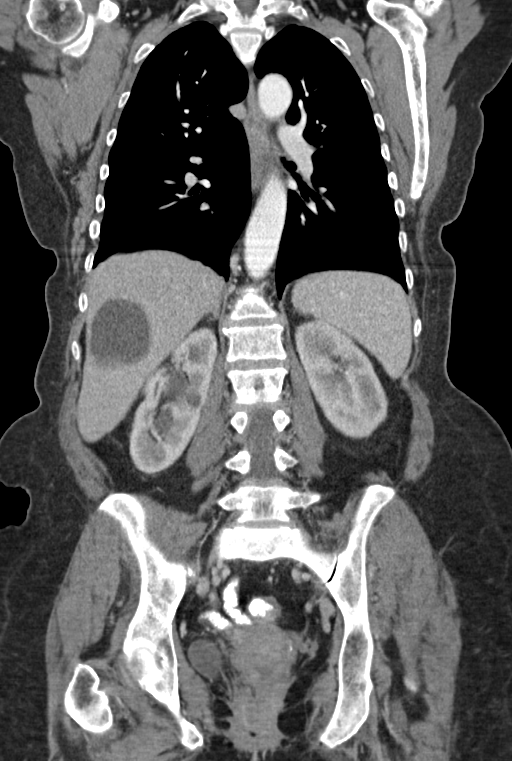

[14 of 46 positions shown; findings below may reference images not displayed]

FINDINGS: CT CHEST FINDINGS

Chest wall: Surgical changes from a left mastectomy. No chest wall
mass to suggest residual tumor. Stable surgical changes in the left
axilla with a small postop fluid collection. The right breast is
unremarkable.

No supraclavicular or axillary lymphadenopathy. Stable small thyroid
nodules.

Cardiovascular: The heart is normal in size. No pericardial
effusion. The aorta is normal in caliber. No dissection. The branch
vessels are patent. Stable coronary artery calcifications.

Mediastinum/Nodes: No mediastinal or hilar mass or adenopathy. The
esophagus is grossly normal.

Lungs/Pleura: No worrisome pulmonary nodules to suggest pulmonary
metastatic disease. No acute pulmonary findings. No pleural
effusion.

Musculoskeletal: Slight progression of osseous metastatic disease.
Destructive changes involving the right third rib with associated
soft tissue component. Mixed lytic and sclerotic T8 lesion is
progressive. The No canal compromise.

CT ABDOMEN PELVIS FINDINGS

Hepatobiliary: Stable numerous benign hepatic cysts. No worrisome
hepatic lesions or intrahepatic biliary dilatation. The portal and
hepatic veins are patent. No common bile duct dilatation.

Pancreas: No mass, inflammation or ductal dilatation.

Spleen: Stable mild splenomegaly and stable lesion in the anterior
aspect of the spleen.

Adrenals/Urinary Tract: The adrenal glands are unremarkable and
stable. Stable numerous renal cysts. Stable renal scarring changes.

Stomach/Bowel: The stomach, duodenum, small bowel and colon are
grossly normal. No inflammatory changes, mass lesions or obstructive
findings. Stable advanced sigmoid diverticulosis. The terminal ileum
and appendix are normal.

Vascular/Lymphatic: The aorta and branch vessels are patent. The
major venous structures are patent. Small scattered mesenteric and
retroperitoneal lymph nodes but no mass or overt adenopathy.

Reproductive: Markedly thickened endometrium for age along or soft
tissue nodularity. This appears to be a relatively stable finding
but correlation with pelvic ultrasound is recommended. Uterine
fibroids are also suspected.

Other: No pelvic lymphadenopathy or free pelvic fluid collections.
No inguinal mass or adenopathy. No abdominal wall hernia or
subcutaneous lesions.

Musculoskeletal: No lytic or sclerotic bone lesions involving the
pelvis or hips.
IMPRESSION: 1. Status post left mastectomy. No findings for chest wall tumor,
supraclavicular or axillary adenopathy.
2. No findings for pulmonary metastatic disease or mediastinal/hilar
adenopathy.
3. Progressive osseous metastatic lesions involving the right third
rib and the T8 vertebral body. No obvious new lesions.
4. Stable splenic lesion.
5. Stable hepatic and renal cysts.
6. Abnormal endometrium for age. This does appear relatively stable
and was not hypermetabolic on the PET scan but I would recommend
correlation with a pelvic ultrasound and any history of vaginal
bleeding.
